# Patient Record
Sex: Male | Born: 1975 | Race: White | Hispanic: No | Marital: Married | Smoking: Former smoker
Health system: Southern US, Community
[De-identification: ages and names within clinical notes are randomized; demographics above are authoritative.]

## PROBLEM LIST (undated history)

## (undated) DIAGNOSIS — G4730 Sleep apnea, unspecified: Secondary | ICD-10-CM

## (undated) DIAGNOSIS — J309 Allergic rhinitis, unspecified: Secondary | ICD-10-CM

## (undated) DIAGNOSIS — K219 Gastro-esophageal reflux disease without esophagitis: Secondary | ICD-10-CM

## (undated) DIAGNOSIS — R0600 Dyspnea, unspecified: Secondary | ICD-10-CM

## (undated) DIAGNOSIS — R5383 Other fatigue: Secondary | ICD-10-CM

## (undated) HISTORY — DX: Gastro-esophageal reflux disease without esophagitis: K21.9

## (undated) HISTORY — DX: Other fatigue: R53.83

## (undated) HISTORY — DX: Dyspnea, unspecified: R06.00

## (undated) HISTORY — DX: Allergic rhinitis, unspecified: J30.9

---
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
## 2007-07-27 ENCOUNTER — Emergency Department (HOSPITAL_COMMUNITY): Admission: EM | Admit: 2007-07-27 | Discharge: 2007-07-27 | Payer: Self-pay | Admitting: Emergency Medicine

## 2008-03-24 ENCOUNTER — Emergency Department (HOSPITAL_COMMUNITY): Admission: EM | Admit: 2008-03-24 | Discharge: 2008-03-24 | Payer: Self-pay | Admitting: Family Medicine

## 2009-03-29 ENCOUNTER — Emergency Department (HOSPITAL_COMMUNITY): Admission: EM | Admit: 2009-03-29 | Discharge: 2009-03-29 | Payer: Self-pay | Admitting: Family Medicine

## 2009-06-01 ENCOUNTER — Encounter: Admission: RE | Admit: 2009-06-01 | Discharge: 2009-06-01 | Payer: Self-pay | Admitting: Internal Medicine

## 2010-07-04 ENCOUNTER — Other Ambulatory Visit: Payer: Self-pay | Admitting: Internal Medicine

## 2010-07-04 DIAGNOSIS — R1032 Left lower quadrant pain: Secondary | ICD-10-CM

## 2010-07-05 ENCOUNTER — Ambulatory Visit
Admission: RE | Admit: 2010-07-05 | Discharge: 2010-07-05 | Disposition: A | Discharge status: Home / Self Care | Payer: 59 | Source: Ambulatory Visit | Attending: Internal Medicine | Admitting: Internal Medicine

## 2010-07-05 DIAGNOSIS — R1032 Left lower quadrant pain: Secondary | ICD-10-CM

## 2010-07-05 MED ORDER — IOHEXOL 300 MG/ML  SOLN
150.0000 mL | Freq: Once | INTRAMUSCULAR | Status: AC | PRN
  Administered 2010-07-05: 125 mL via INTRAVENOUS

## 2010-12-14 ENCOUNTER — Emergency Department (INDEPENDENT_AMBULATORY_CARE_PROVIDER_SITE_OTHER)
Admission: EM | Admit: 2010-12-14 | Discharge: 2010-12-14 | Disposition: A | Discharge status: Home / Self Care | Payer: 59 | Source: Home / Self Care | Attending: Family Medicine | Admitting: Family Medicine

## 2010-12-14 ENCOUNTER — Encounter: Payer: Self-pay | Admitting: Cardiology

## 2010-12-14 DIAGNOSIS — L0102 Bockhart's impetigo: Secondary | ICD-10-CM

## 2010-12-14 DIAGNOSIS — L738 Other specified follicular disorders: Secondary | ICD-10-CM

## 2010-12-14 MED ORDER — MUPIROCIN CALCIUM 2 % EX CREA: TOPICAL_CREAM | Freq: Three times a day (TID) | CUTANEOUS | Status: AC

## 2010-12-14 NOTE — ED Notes (Signed)
Pt reports ingrown hair to testicle area that started 2 weeks ago. Denies fever. Pt reports area to be reddened some swelling tot the area. Pt squeezed area this morning with pink drainage.

## 2010-12-14 NOTE — ED Provider Notes (Signed)
History     CSN: [id number] Arrival date & time: 12/14/2010  8:39 AM   First MD Initiated Contact with Patient 12/14/10 [phone]      No chief complaint on file.   (Consider location/radiation/quality/duration/timing/severity/associated sxs/prior treatment) Patient is a 35 y.o. male presenting with abscess.  Abscess  This is a recurrent problem. The current episode started more than one week ago. The onset was gradual. The problem has been gradually improving. The abscess is present on the genitalia. The problem is mild. The abscess is characterized by redness and draining. The abscess first occurred at home.    No past medical history on file.  No past surgical history on file.  No family history on file.  History  Substance Use Topics  . Smoking status: Not on file  . Smokeless tobacco: Not on file  . Alcohol Use: Not on file      Review of Systems  Constitutional: Negative.   Skin: Positive for wound.    Allergies  Iodinated diagnostic agents  Home Medications   Current Outpatient Rx  Name Route Sig Dispense Refill  . MUPIROCIN CALCIUM 2 % EX CREA Topical Apply topically 3 (three) times daily. 15 g 0    BP 128/61  Pulse 79  Temp(Src) 98.2 F (36.8 C) (Oral)  Resp 16  SpO2 100%  Physical Exam  Nursing note and vitals reviewed. Constitutional: He appears well-developed and well-nourished.  Skin: Skin is warm.       ED Course  Procedures (including critical care time)  Labs Reviewed - No data to display No results found.   1. Pustular folliculitis       MDM          Barkley Bruns, MD 12/14/10 [phone]

## 2013-07-06 ENCOUNTER — Emergency Department (HOSPITAL_COMMUNITY)
Admission: EM | Admit: 2013-07-06 | Discharge: 2013-07-06 | Disposition: A | Discharge status: Home / Self Care | Payer: 59 | Attending: Emergency Medicine | Admitting: Emergency Medicine

## 2013-07-06 ENCOUNTER — Encounter (HOSPITAL_COMMUNITY): Payer: Self-pay | Admitting: Emergency Medicine

## 2013-07-06 DIAGNOSIS — K644 Residual hemorrhoidal skin tags: Secondary | ICD-10-CM | POA: Insufficient documentation

## 2013-07-06 DIAGNOSIS — F17200 Nicotine dependence, unspecified, uncomplicated: Secondary | ICD-10-CM | POA: Insufficient documentation

## 2013-07-06 DIAGNOSIS — Z79899 Other long term (current) drug therapy: Secondary | ICD-10-CM | POA: Insufficient documentation

## 2013-07-06 DIAGNOSIS — IMO0002 Reserved for concepts with insufficient information to code with codable children: Secondary | ICD-10-CM | POA: Insufficient documentation

## 2013-07-06 DIAGNOSIS — K922 Gastrointestinal hemorrhage, unspecified: Secondary | ICD-10-CM | POA: Insufficient documentation

## 2013-07-06 LAB — CBC WITH DIFFERENTIAL/PLATELET
BASOS ABS: 0 10*3/uL (ref 0.0–0.1)
BASOS PCT: 0 % (ref 0–1)
EOS ABS: 0.1 10*3/uL (ref 0.0–0.7)
Eosinophils Relative: 1 % (ref 0–5)
HCT: 42.5 % (ref 39.0–52.0)
Hemoglobin: 14 g/dL (ref 13.0–17.0)
Lymphocytes Relative: 20 % (ref 12–46)
Lymphs Abs: 2.1 10*3/uL (ref 0.7–4.0)
MCH: 27.9 pg (ref 26.0–34.0)
MCHC: 32.9 g/dL (ref 30.0–36.0)
MCV: 84.8 fL (ref 78.0–100.0)
Monocytes Absolute: 0.7 10*3/uL (ref 0.1–1.0)
Monocytes Relative: 7 % (ref 3–12)
Neutro Abs: 7.7 10*3/uL (ref 1.7–7.7)
Neutrophils Relative %: 72 % (ref 43–77)
Platelets: 312 10*3/uL (ref 150–400)
RBC: 5.01 MIL/uL (ref 4.22–5.81)
RDW: 14.3 % (ref 11.5–15.5)
WBC: 10.7 10*3/uL — AB (ref 4.0–10.5)

## 2013-07-06 MED ORDER — HYDROCORTISONE 2.5 % RE CREA: 1.0000 "application " | TOPICAL_CREAM | Freq: Two times a day (BID) | RECTAL | Status: DC

## 2013-07-06 MED ORDER — PSYLLIUM 28 % PO PACK: 1.0000 | PACK | Freq: Two times a day (BID) | ORAL | Status: DC

## 2013-07-06 MED ORDER — DOCUSATE SODIUM 100 MG PO CAPS: 100.0000 mg | ORAL_CAPSULE | Freq: Two times a day (BID) | ORAL | Status: DC

## 2013-07-06 NOTE — ED Provider Notes (Signed)
CSN: [id number]     Arrival date & time 07/06/13  1439 History   First MD Initiated Contact with Patient 07/06/13 1512     Chief Complaint  Patient presents with  . Rectal Bleeding     (Consider location/radiation/quality/duration/timing/severity/associated sxs/prior Treatment) HPI Comments: Pt comes in with cc of bloody stools. Intermittent constipation, no hx of bleeding in the past. Reports noticing a rectal mass on Sunday. There was some blood when he wiped y'day, and today, he has been having blood squirt out intermittently. Pt saw his pcp - who sent him to the ER. There is no hx of liver dz, alcohol abuse, heavy nsaid use, prednisone use. Pt has no chest pain, dib, dizziness.  Patient is a 38 y.o. male presenting with hematochezia. The history is provided by the patient.  Rectal Bleeding Associated symptoms: no abdominal pain and no vomiting     History reviewed. No pertinent past medical history. Past Surgical History  Procedure Laterality Date  . Breast surgery  1997    removal of fatty tissue from right breast.   Family History  Problem Relation Age of Onset  . Hypertension Mother    History  Substance Use Topics  . Smoking status: Current Every Day Smoker -- 0.50 packs/day  . Smokeless tobacco: Not on file  . Alcohol Use: Yes     Comment: occas  1-2 bdeers/wk    Review of Systems  Constitutional: Negative for activity change and appetite change.  Respiratory: Negative for cough and shortness of breath.   Cardiovascular: Negative for chest pain.  Gastrointestinal: Positive for constipation, blood in stool and hematochezia. Negative for nausea, vomiting, abdominal pain and diarrhea.  Genitourinary: Negative for dysuria.  Hematological: Does not bruise/bleed easily.      Allergies  Iodinated diagnostic agents; Amoxicillin; Biaxin; and Penicillins  Home Medications   Prior to Admission medications   Medication Sig Start Date End Date Taking? Authorizing  Provider  Multiple Vitamin (MULTIVITAMIN WITH MINERALS) TABS tablet Take 1 tablet by mouth daily.   Yes Historical Provider, MD  ranitidine (ZANTAC) 150 MG tablet Take 150 mg by mouth 2 (two) times daily.   Yes Historical Provider, MD  docusate sodium (COLACE) 100 MG capsule Take 1 capsule (100 mg total) by mouth every 12 (twelve) hours. 07/06/13   Derwood KaplanAnkit Nanavati, MD  hydrocortisone (ANUSOL-HC) 2.5 % rectal cream Place 1 application rectally 2 (two) times daily. 07/06/13   Derwood KaplanAnkit Nanavati, MD  psyllium (METAMUCIL SMOOTH TEXTURE) 28 % packet Take 1 packet by mouth 2 (two) times daily. 07/06/13   Ankit Nanavati, MD   BP 107/58  Pulse 69  Temp(Src) 99.2 F (37.3 C) (Oral)  Resp 20  SpO2 98% Physical Exam  Nursing note and vitals reviewed. Constitutional: He is oriented to person, place, and time. He appears well-developed.  HENT:  Head: Normocephalic and atraumatic.  Eyes: Conjunctivae and EOM are normal. Pupils are equal, round, and reactive to light.  Neck: Normal range of motion. Neck supple.  Cardiovascular: Normal rate and regular rhythm.   Pulmonary/Chest: Effort normal and breath sounds normal.  Abdominal: Soft. Bowel sounds are normal. He exhibits no distension. There is no tenderness. There is no rebound and no guarding.  Pt has an external hemorrhoid - abot 2-3 cm in DM appreciated, with no rectal mass on DRE. Pt has dark blood per rectal exam.  Neurological: He is alert and oriented to person, place, and time.  Skin: Skin is warm.    ED Course  Procedures (including critical care time) Labs Review Labs Reviewed  CBC WITH DIFFERENTIAL - Abnormal; Notable for the following:    WBC 10.7 (*)    All other components within normal limits  POCT GASTRIC OCCULT BLOOD (1-CARD TO LAB)    Imaging Review No results found.   EKG Interpretation None      MDM   Final diagnoses:  Lower GI bleed  External hemorrhoid, bleeding    DDx includes: Diverticular bleed Colon  cancer Rectal bleed Internal hemorrhoids External hemorrhoids  Pt with mildly painful lesion - consistent with external hemorrhoids. There is no thrombosis or hemorrhoids. Advised on SITz bath and stool softeners provided. Pt will be provided with GI f/u for the GI bleed. Return to the ER if the sx get worse.     Derwood KaplanAnkit Nanavati, MD 07/06/13 1752

## 2013-07-06 NOTE — ED Notes (Signed)
Per pt, had some hard stool on Sunday and had some blood in stool-went to PCP and when he did exam, blood squirted out-was told to come to ED for further exam

## 2013-07-06 NOTE — Discharge Instructions (Signed)
You have rectal bleeding from external hemorrhoids. You might even have internal hemorrhoid. Please take the meds as prescribed. Please use SITZ bath (read below) at least 2-3 times a day for 10 min. Please follow up with GI. Return to the ER if there is increased bleeding, pain, fevers, chills.   Hemorrhoids Hemorrhoids are swollen veins around the rectum or anus. There are two types of hemorrhoids:   Internal hemorrhoids. These occur in the veins just inside the rectum. They may poke through to the outside and become irritated and painful.  External hemorrhoids. These occur in the veins outside the anus and can be felt as a painful swelling or hard lump near the anus. CAUSES  Pregnancy.   Obesity.   Constipation or diarrhea.   Straining to have a bowel movement.   Sitting for long periods on the toilet.  Heavy lifting or other activity that caused you to strain.  Anal intercourse. SYMPTOMS   Pain.   Anal itching or irritation.   Rectal bleeding.   Fecal leakage.   Anal swelling.   One or more lumps around the anus.  DIAGNOSIS  Your caregiver may be able to diagnose hemorrhoids by visual examination. Other examinations or tests that may be performed include:   Examination of the rectal area with a gloved hand (digital rectal exam).   Examination of anal canal using a small tube (scope).   A blood test if you have lost a significant amount of blood.  A test to look inside the colon (sigmoidoscopy or colonoscopy). TREATMENT Most hemorrhoids can be treated at home. However, if symptoms do not seem to be getting better or if you have a lot of rectal bleeding, your caregiver may perform a procedure to help make the hemorrhoids get smaller or remove them completely. Possible treatments include:  1. Placing a rubber band at the base of the hemorrhoid to cut off the circulation (rubber band ligation).  2. Injecting a chemical to shrink the hemorrhoid  (sclerotherapy).  3. Using a tool to burn the hemorrhoid (infrared light therapy).  4. Surgically removing the hemorrhoid (hemorrhoidectomy).  5. Stapling the hemorrhoid to block blood flow to the tissue (hemorrhoid stapling).  HOME CARE INSTRUCTIONS   Eat foods with fiber, such as whole grains, beans, nuts, fruits, and vegetables. Ask your doctor about taking products with added fiber in them (fibersupplements).  Increase fluid intake. Drink enough water and fluids to keep your urine clear or pale yellow.   Exercise regularly.   Go to the bathroom when you have the urge to have a bowel movement. Do not wait.   Avoid straining to have bowel movements.   Keep the anal area dry and clean. Use wet toilet paper or moist towelettes after a bowel movement.   Medicated creams and suppositories may be used or applied as directed.   Only take over-the-counter or prescription medicines as directed by your caregiver.   Take warm sitz baths for 15-20 minutes, 3-4 times a day to ease pain and discomfort.   Place ice packs on the hemorrhoids if they are tender and swollen. Using ice packs between sitz baths may be helpful.   Put ice in a plastic bag.   Place a towel between your skin and the bag.   Leave the ice on for 15-20 minutes, 3-4 times a day.   Do not use a donut-shaped pillow or sit on the toilet for long periods. This increases blood pooling and pain.  SEEK MEDICAL CARE  IF:  You have increasing pain and swelling that is not controlled by treatment or medicine.  You have uncontrolled bleeding.  You have difficulty or you are unable to have a bowel movement.  You have pain or inflammation outside the area of the hemorrhoids. MAKE SURE YOU:  Understand these instructions.  Will watch your condition.  Will get help right away if you are not doing well or get worse. Document Released: 12/22/1999 Document Revised: 12/11/2011 Document Reviewed:  10/29/2011 [facility] Patient Information 2015 [place], [place]. This information is not intended to replace advice given to you by your health care provider. Make sure you discuss any questions you have with your health care provider. Sitz Bath A sitz bath is a warm water bath taken in the sitting position that covers only the hips and buttocks. It may be used for either healing or hygiene purposes. Sitz baths are also used to relieve pain, itching, or muscle spasms. The water may contain medicine. Moist heat will help you heal and relax.  HOME CARE INSTRUCTIONS  Take 3 to 4 sitz baths a day. 6. Fill the bathtub half full with warm water. 7. Sit in the water and open the drain a little. 8. Turn on the warm water to keep the tub half full. Keep the water running constantly. 9. Soak in the water for 15 to 20 minutes. 10. After the sitz bath, pat the affected area dry first. SEEK MEDICAL CARE IF:  You get worse instead of better. Stop the sitz baths if you get worse. MAKE SURE YOU:  Understand these instructions.  Will watch your condition.  Will get help right away if you are not doing well or get worse. Document Released: 09/16/2003 Document Revised: 09/18/2011 Document Reviewed: 03/23/2010 [facility] Patient Information 2015 [place], [place]. This information is not intended to replace advice given to you by your health care provider. Make sure you discuss any questions you have with your health care provider.

## 2013-07-07 ENCOUNTER — Ambulatory Visit (INDEPENDENT_AMBULATORY_CARE_PROVIDER_SITE_OTHER): Payer: 59 | Admitting: Internal Medicine

## 2013-07-07 ENCOUNTER — Encounter: Payer: Self-pay | Admitting: Internal Medicine

## 2013-07-07 VITALS — BP 122/88 | HR 92 | Ht 71.75 in | Wt 322.1 lb

## 2013-07-07 DIAGNOSIS — K645 Perianal venous thrombosis: Secondary | ICD-10-CM

## 2013-07-07 NOTE — Patient Instructions (Addendum)
You have been given a separate informational sheet regarding your tobacco use, the importance of quitting and local resources to help you quit.  Continue your colace.  Soak in the tub, sitz bath information given.  Today you have been given a hemorrhoid handout.  If your not improving call us back and we want to see you back in 6-8 weeks, appointment made for 09/10/13 at 8:30am.   I appreciate the opportunity to care for you.

## 2013-07-07 NOTE — Progress Notes (Signed)
Subjective:    Patient ID: Kevin Michael, male    DOB: 11/18/1975, 38 y.o.   MRN: [id number]  HPI The patient is a pleasant single white man with a few day history of anal pain. He noticed painful swelling in the anal area a few days ago. He went fishing from his kayak and things worsened. Then slight bleeding - bright red.  Saw PCP and says that during examination of the anorectum he started to bleed. "something popped". Profuse bleeding and was sent to ED - bright red blood and clots seen - dark clots. In ED CBC ok, dx hemorrhoids and dc. Since then (yesterday) has continued to have pain, ? Less and oozing of blood. Denies bloody stools. Has not defecated in 2 days. Using sitz bath with some help. Proctocream HC 2.5% Rx has not started.  Using stool softeners/psyllium.  Allergies  Allergen Reactions  . Iodinated Diagnostic Agents Itching and Rash    suggested 13 hour prep if cintrast is needed in future/mms  . Amoxicillin     As child   . Biaxin [Clarithromycin] Hives  . Penicillins     Rxn as child "made real sick"   Outpatient Prescriptions Prior to Visit  Medication Sig Dispense Refill  . docusate sodium (COLACE) 100 MG capsule Take 1 capsule (100 mg total) by mouth every 12 (twelve) hours.  60 capsule  0  . hydrocortisone (ANUSOL-HC) 2.5 % rectal cream Place 1 application rectally 2 (two) times daily.  30 g  0  . Multiple Vitamin (MULTIVITAMIN WITH MINERALS) TABS tablet Take 1 tablet by mouth daily.      . psyllium (METAMUCIL SMOOTH TEXTURE) 28 % packet Take 1 packet by mouth 2 (two) times daily.  30 packet  0  . ranitidine (ZANTAC) 150 MG tablet Take 150 mg by mouth 2 (two) times daily.       No facility-administered medications prior to visit.   Past Medical History  Diagnosis Date  . Allergic rhinitis   . GERD (gastroesophageal reflux disease)    Past Surgical History  Procedure Laterality Date  . Breast surgery Right 1997    removal of fatty tissue from  right breast.   History   Social History  . Marital Status: Single    Spouse Name: N/A    Number of Children: 0  . Years of Education: N/A   Occupational History  . heavy Arboriculturistequipment operator    Social History Main Topics  . Smoking status: Current Some Day Smoker -- 0.50 packs/day    Types: Cigarettes  . Smokeless tobacco: Never Used  . Alcohol Use: Yes     Comment: occas  1-2 bdeers/wk  . Drug Use: No    Social History Narrative   Single, no children   Heavy Arboriculturistequipment operator   1 or less caffeine drinks daily   Family History  Problem Relation Age of Onset  . Hypertension Mother   . Bladder Cancer Father    Review of Systems All other ROS negative except as per HPI    Objective:   Physical Exam WDWN WM NAD A and o x 3 anicteric Rectal  Patti [place], CMA present  Thrombosed RA external hemorrhoid with clot, ooze of blood Able to examine DRE after lidocaine, NTG No fissure, anal canal ok    Assessment & Plan:   Thrombosed external hemorrhoid - right anterior   Colace, fiber Recticare Sitz baths RTC 6- 8 weeks He  is off until 7/7  I appreciate the opportunity to care for this patient Iva Booparl E. Sharleen Szczesny, MD, FACG  ZO:[id number]:MACKENZIE,BRIAN, MD

## 2013-07-08 ENCOUNTER — Encounter: Payer: Self-pay | Admitting: Internal Medicine

## 2013-07-08 DIAGNOSIS — K645 Perianal venous thrombosis: Secondary | ICD-10-CM | POA: Insufficient documentation

## 2013-09-10 ENCOUNTER — Ambulatory Visit: Payer: 59 | Admitting: Internal Medicine

## 2014-01-23 ENCOUNTER — Emergency Department: Payer: Self-pay | Admitting: Emergency Medicine

## 2014-01-23 LAB — CBC
HCT: 42.7 % (ref 40.0–52.0)
HGB: 13.8 g/dL (ref 13.0–18.0)
MCH: 28.1 pg (ref 26.0–34.0)
MCHC: 32.3 g/dL (ref 32.0–36.0)
MCV: 87 fL (ref 80–100)
Platelet: 316 10*3/uL (ref 150–440)
RBC: 4.9 10*6/uL (ref 4.40–5.90)
RDW: 14.9 % — ABNORMAL HIGH (ref 11.5–14.5)
WBC: 12.3 10*3/uL — AB (ref 3.8–10.6)

## 2014-01-23 LAB — COMPREHENSIVE METABOLIC PANEL
ANION GAP: 10 (ref 7–16)
Albumin: 3.9 g/dL (ref 3.4–5.0)
Alkaline Phosphatase: 84 U/L
BILIRUBIN TOTAL: 0.3 mg/dL (ref 0.2–1.0)
BUN: 21 mg/dL — ABNORMAL HIGH (ref 7–18)
CHLORIDE: 107 mmol/L (ref 98–107)
CO2: 24 mmol/L (ref 21–32)
Calcium, Total: 8.6 mg/dL (ref 8.5–10.1)
Creatinine: 1.25 mg/dL (ref 0.60–1.30)
GLUCOSE: 94 mg/dL (ref 65–99)
OSMOLALITY: 284 (ref 275–301)
POTASSIUM: 3.7 mmol/L (ref 3.5–5.1)
SGOT(AST): 22 U/L (ref 15–37)
SGPT (ALT): 35 U/L
SODIUM: 141 mmol/L (ref 136–145)
Total Protein: 7.8 g/dL (ref 6.4–8.2)

## 2014-01-23 LAB — MONONUCLEOSIS SCREEN: Mono Test: NEGATIVE

## 2014-07-30 ENCOUNTER — Emergency Department
Admission: EM | Admit: 2014-07-30 | Discharge: 2014-07-30 | Disposition: A | Discharge status: Home / Self Care | Payer: Commercial Managed Care - HMO | Attending: Emergency Medicine | Admitting: Emergency Medicine

## 2014-07-30 ENCOUNTER — Encounter: Payer: Self-pay | Admitting: Emergency Medicine

## 2014-07-30 DIAGNOSIS — Z79899 Other long term (current) drug therapy: Secondary | ICD-10-CM | POA: Insufficient documentation

## 2014-07-30 DIAGNOSIS — Z7951 Long term (current) use of inhaled steroids: Secondary | ICD-10-CM | POA: Insufficient documentation

## 2014-07-30 DIAGNOSIS — B9689 Other specified bacterial agents as the cause of diseases classified elsewhere: Secondary | ICD-10-CM

## 2014-07-30 DIAGNOSIS — Z720 Tobacco use: Secondary | ICD-10-CM | POA: Diagnosis not present

## 2014-07-30 DIAGNOSIS — R1030 Lower abdominal pain, unspecified: Secondary | ICD-10-CM | POA: Diagnosis present

## 2014-07-30 DIAGNOSIS — L089 Local infection of the skin and subcutaneous tissue, unspecified: Secondary | ICD-10-CM | POA: Diagnosis not present

## 2014-07-30 DIAGNOSIS — Z880 Allergy status to penicillin: Secondary | ICD-10-CM | POA: Insufficient documentation

## 2014-07-30 MED ORDER — TRAMADOL HCL 50 MG PO TABS: 50.0000 mg | ORAL_TABLET | Freq: Four times a day (QID) | ORAL | Status: DC | PRN

## 2014-07-30 MED ORDER — SULFAMETHOXAZOLE-TRIMETHOPRIM 800-160 MG PO TABS: 1.0000 | ORAL_TABLET | Freq: Two times a day (BID) | ORAL | Status: DC

## 2014-07-30 NOTE — ED Notes (Signed)
AAOx3.  Skin warm and dry.  NAD.  D/C home 
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
## 2015-08-21 ENCOUNTER — Ambulatory Visit: Payer: Self-pay | Admitting: Family Medicine

## 2015-09-18 ENCOUNTER — Ambulatory Visit: Payer: Self-pay | Admitting: Family Medicine

## 2015-10-16 ENCOUNTER — Ambulatory Visit (INDEPENDENT_AMBULATORY_CARE_PROVIDER_SITE_OTHER): Payer: Commercial Managed Care - HMO | Admitting: Family Medicine

## 2015-10-16 ENCOUNTER — Encounter: Payer: Self-pay | Admitting: Family Medicine

## 2015-10-16 VITALS — BP 124/77 | HR 81 | Ht 71.25 in | Wt 338.2 lb

## 2015-10-16 DIAGNOSIS — K5909 Other constipation: Secondary | ICD-10-CM

## 2015-10-16 DIAGNOSIS — K219 Gastro-esophageal reflux disease without esophagitis: Secondary | ICD-10-CM | POA: Insufficient documentation

## 2015-10-16 DIAGNOSIS — Z87898 Personal history of other specified conditions: Secondary | ICD-10-CM

## 2015-10-16 DIAGNOSIS — Z6841 Body Mass Index (BMI) 40.0 and over, adult: Secondary | ICD-10-CM | POA: Diagnosis not present

## 2015-10-16 DIAGNOSIS — R7303 Prediabetes: Secondary | ICD-10-CM | POA: Insufficient documentation

## 2015-10-16 DIAGNOSIS — E6609 Other obesity due to excess calories: Secondary | ICD-10-CM

## 2015-10-16 DIAGNOSIS — K5900 Constipation, unspecified: Secondary | ICD-10-CM | POA: Insufficient documentation

## 2015-10-16 DIAGNOSIS — A6000 Herpesviral infection of urogenital system, unspecified: Secondary | ICD-10-CM

## 2015-10-16 DIAGNOSIS — J3089 Other allergic rhinitis: Secondary | ICD-10-CM

## 2015-10-16 NOTE — Patient Instructions (Signed)
Please realize, EXERCISE IS MEDICINE!  -  American Heart Association [facility]) guidelines for exercise : If you are in good health, without any medical conditions, you should engage in 150 minutes of moderate intensity aerobic activity per week.  This means you should be huffing and puffing throughout your workout.   Engaging in regular exercise will improve brain function and memory, as well as improve mood, boost immune system and help with weight management.  As well as the other, more well-known effects of exercise such as decreasing blood sugar levels, decreasing blood pressure,  and decreasing bad cholesterol levels/ increasing good cholesterol levels.     -  The AHA strongly endorses consumption of a diet that contains a variety of foods from all the food categories with an emphasis on fruits and vegetables; fat-free and low-fat dairy products; cereal and grain products; legumes and nuts; and fish, poultry, and/or extra lean meats.    Excessive food intake, especially of foods high in saturated and trans fats, sugar, and salt, should be avoided.    Adequate water intake of roughly 1/2 of your weight in pounds, should equal the ounces of water per day you should drink.  So for instance, if you're 200 pounds, that would be 100 ounces of water per day.         Mediterranean Diet  Why follow it? Research shows. . Those who follow the Mediterranean diet have a reduced risk of heart disease  . The diet is associated with a reduced incidence of Parkinson's and Alzheimer's diseases . People following the diet may have longer life expectancies and lower rates of chronic diseases  . The Dietary Guidelines for Americans recommends the Mediterranean diet as an eating plan to promote health and prevent disease  What Is the Mediterranean Diet?  . Healthy eating plan based on typical foods and recipes of Mediterranean-style cooking . The diet is primarily a plant based diet; these foods should make up a  majority of meals   Starches - Plant based foods should make up a majority of meals - They are an important sources of vitamins, minerals, energy, antioxidants, and fiber - Choose whole grains, foods high in fiber and minimally processed items  - Typical grain sources include wheat, oats, barley, corn, brown rice, bulgar, farro, millet, polenta, couscous  - Various types of beans include chickpeas, lentils, fava beans, black beans, white beans   Fruits  Veggies - Large quantities of antioxidant rich fruits & veggies; 6 or more servings  - Vegetables can be eaten raw or lightly drizzled with oil and cooked  - Vegetables common to the traditional Mediterranean Diet include: artichokes, arugula, beets, broccoli, brussel sprouts, cabbage, carrots, celery, collard greens, cucumbers, eggplant, kale, leeks, lemons, lettuce, mushrooms, okra, onions, peas, peppers, potatoes, pumpkin, radishes, rutabaga, shallots, spinach, sweet potatoes, turnips, zucchini - Fruits common to the Mediterranean Diet include: apples, apricots, avocados, cherries, clementines, dates, figs, grapefruits, grapes, melons, nectarines, oranges, peaches, pears, pomegranates, strawberries, tangerines  Fats - Replace butter and margarine with healthy oils, such as olive oil, canola oil, and tahini  - Limit nuts to no more than a handful a day  - Nuts include walnuts, almonds, pecans, pistachios, pine nuts  - Limit or avoid candied, honey roasted or heavily salted nuts - Olives are central to the Mediterranean diet - can be eaten whole or used in a variety of dishes   Meats Protein - Limiting red meat: no more than a few times a month -  When eating red meat: choose lean cuts and keep the portion to the size of deck of cards - Eggs: approx. 0 to 4 times a week  - Fish and lean poultry: at least 2 a week  - Healthy protein sources include, chicken, turkey, lean beef, lamb - Increase intake of seafood such as tuna, salmon, trout,  mackerel, shrimp, scallops - Avoid or limit high fat processed meats such as sausage and bacon  Dairy - Include moderate amounts of low fat dairy products  - Focus on healthy dairy such as fat free yogurt, skim milk, low or reduced fat cheese - Limit dairy products higher in fat such as whole or 2% milk, cheese, ice cream  Alcohol - Moderate amounts of red wine is ok  - No more than 5 oz daily for women (all ages) and men older than age 65  - No more than 10 oz of wine daily for men younger than 65  Other - Limit sweets and other desserts  - Use herbs and spices instead of salt to flavor foods  - Herbs and spices common to the traditional Mediterranean Diet include: basil, bay leaves, chives, cloves, cumin, fennel, garlic, lavender, marjoram, mint, oregano, parsley, pepper, rosemary, sage, savory, sumac, tarragon, thyme   It's not just a diet, it's a lifestyle:  . The Mediterranean diet includes lifestyle factors typical of those in the region  . Foods, drinks and meals are best eaten with others and savored . Daily physical activity is important for overall good health . This could be strenuous exercise like running and aerobics . This could also be more leisurely activities such as walking, housework, yard-work, or taking the stairs . Moderation is the key; a balanced and healthy diet accommodates most foods and drinks . Consider portion sizes and frequency of consumption of certain foods   Meal Ideas & Options:  . Breakfast:  o Whole wheat toast or whole wheat English muffins with peanut butter & hard boiled egg o Steel cut oats topped with apples & cinnamon and skim milk  o Fresh fruit: banana, strawberries, melon, berries, peaches  o Smoothies: strawberries, bananas, greek yogurt, peanut butter o Low fat greek yogurt with blueberries and granola  o Egg white omelet with spinach and mushrooms o Breakfast couscous: whole wheat couscous, apricots, skim milk, cranberries  . Sandwiches:   o Hummus and grilled vegetables (peppers, zucchini, squash) on whole wheat bread   o Grilled chicken on whole wheat pita with lettuce, tomatoes, cucumbers or tzatziki  o Tuna salad on whole wheat bread: tuna salad made with greek yogurt, olives, red peppers, capers, green onions o Garlic rosemary lamb pita: lamb sauted with garlic, rosemary, salt & pepper; add lettuce, cucumber, greek yogurt to pita - flavor with lemon juice and black pepper  . Seafood:  o Mediterranean grilled salmon, seasoned with garlic, basil, parsley, lemon juice and black pepper o Shrimp, lemon, and spinach whole-grain pasta salad made with low fat greek yogurt  o Seared scallops with lemon orzo  o Seared tuna steaks seasoned salt, pepper, coriander topped with tomato mixture of olives, tomatoes, olive oil, minced garlic, parsley, green onions and cappers  . Meats:  o Herbed greek chicken salad with kalamata olives, cucumber, feta  o Red bell peppers stuffed with spinach, bulgur, lean ground beef (or lentils) & topped with feta   o Kebabs: skewers of chicken, tomatoes, onions, zucchini, squash  o Turkey burgers: made with red onions, mint, dill, lemon juice, feta   cheese topped with roasted red peppers . Vegetarian o Cucumber salad: cucumbers, artichoke hearts, celery, red onion, feta cheese, tossed in olive oil & lemon juice  o Hummus and whole grain pita points with a greek salad (lettuce, tomato, feta, olives, cucumbers, red onion) o Lentil soup with celery, carrots made with vegetable broth, garlic, salt and pepper  o Tabouli salad: parsley, bulgur, mint, scallions, cucumbers, tomato, radishes, lemon juice, olive oil, salt and pepper.     Obesity Obesity is defined as having too much total body fat and a body mass index (BMI) of 30 or more. BMI is an estimate of body fat and is calculated from your height and weight. BMI is typically calculated by your health care provider during regular wellness visits. Obesity  happens when you consume more calories than you can burn by exercising or performing daily physical tasks. Prolonged obesity can cause major illnesses or emergencies, such as:  Stroke.  Heart disease.  Diabetes.  Cancer.  Arthritis.  High blood pressure (hypertension).  High cholesterol.  Sleep apnea.  Erectile dysfunction.  Infertility problems. CAUSES   Regularly eating unhealthy foods.  Physical inactivity.  Certain disorders, such as an underactive thyroid (hypothyroidism), Cushing's syndrome, and polycystic ovarian syndrome.  Certain medicines, such as steroids, some depression medicines, and antipsychotics.  Genetics.  Lack of sleep. DIAGNOSIS A health care provider can diagnose obesity after calculating your BMI. Obesity will be diagnosed if your BMI is 30 or higher. There are other methods of measuring obesity levels. Some other methods include measuring your skinfold thickness, your waist circumference, and comparing your hip circumference to your waist circumference. TREATMENT  A healthy treatment program includes some or all of the following:  Long-term dietary changes.  Exercise and physical activity.  Behavioral and lifestyle changes.  Medicine only under the supervision of your health care provider. Medicines may help, but only if they are used with diet and exercise programs. If your BMI is 40 or higher, your health care provider may recommend specialized surgery or programs to help with weight loss. An unhealthy treatment program includes:  Fasting.  Fad diets.  Supplements and drugs. These choices do not succeed in long-term weight control. HOME CARE INSTRUCTIONS  Exercise and perform physical activity as directed by your health care provider. To increase physical activity, try the following:  Use stairs instead of elevators.  Park farther away from store entrances.  Garden, bike, or walk instead of watching television or using the  computer.  Eat healthy, low-calorie foods and drinks on a regular basis. Eat more fruits and vegetables. Use low-calorie cookbooks or take healthy cooking classes.  Limit fast food, sweets, and processed snack foods.  Eat smaller portions.  Keep a daily journal of everything you eat. There are many free websites to help you with this. It may be helpful to measure your foods so you can determine if you are eating the correct portion sizes.  Avoid drinking alcohol. Drink more water and drinks without calories.  Take vitamins and supplements only as recommended by your health care provider.  Weight-loss support groups, Government social research officer, counselors, and stress reduction education can also be very helpful. SEEK IMMEDIATE MEDICAL CARE IF:  You have chest pain or tightness.  You have trouble breathing or feel short of breath.  You have weakness or leg numbness.  You feel confused or have trouble talking.  You have sudden changes in your vision.   This information is not intended to replace advice given to  you by your health care provider. Make sure you discuss any questions you have with your health care provider.   Document Released: 02/01/2004 Document Revised: 01/14/2014 Document Reviewed: 01/30/2011 Elsevier Interactive Patient Education [employer]2016 [employer]vier Inc.
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
## 2015-10-16 NOTE — Assessment & Plan Note (Signed)
outbrks a couple time per month

## 2015-11-22 ENCOUNTER — Other Ambulatory Visit (INDEPENDENT_AMBULATORY_CARE_PROVIDER_SITE_OTHER): Payer: Commercial Managed Care - HMO

## 2015-11-22 ENCOUNTER — Other Ambulatory Visit (HOSPITAL_COMMUNITY)
Admission: RE | Admit: 2015-11-22 | Discharge: 2015-11-22 | Disposition: A | Discharge status: Home / Self Care | Payer: Commercial Managed Care - HMO | Source: Ambulatory Visit | Attending: Family Medicine | Admitting: Family Medicine

## 2015-11-22 DIAGNOSIS — K219 Gastro-esophageal reflux disease without esophagitis: Secondary | ICD-10-CM

## 2015-11-22 DIAGNOSIS — Z6841 Body Mass Index (BMI) 40.0 and over, adult: Secondary | ICD-10-CM

## 2015-11-22 DIAGNOSIS — K5909 Other constipation: Secondary | ICD-10-CM

## 2015-11-22 DIAGNOSIS — Z87898 Personal history of other specified conditions: Secondary | ICD-10-CM

## 2015-11-22 DIAGNOSIS — J3089 Other allergic rhinitis: Secondary | ICD-10-CM

## 2015-11-22 DIAGNOSIS — Z113 Encounter for screening for infections with a predominantly sexual mode of transmission: Secondary | ICD-10-CM | POA: Diagnosis present

## 2015-11-22 DIAGNOSIS — A6000 Herpesviral infection of urogenital system, unspecified: Secondary | ICD-10-CM

## 2015-11-22 LAB — URINE CYTOLOGY ANCILLARY ONLY
Chlamydia: NEGATIVE
Neisseria Gonorrhea: NEGATIVE
TRICH (WINDOWPATH): NEGATIVE

## 2015-11-23 LAB — TSH: TSH: 2.04 m[IU]/L (ref 0.40–4.50)

## 2015-11-23 LAB — CBC WITH DIFFERENTIAL/PLATELET
BASOS ABS: 54 {cells}/uL (ref 0–200)
BASOS PCT: 1 %
EOS ABS: 108 {cells}/uL (ref 15–500)
Eosinophils Relative: 2 %
HCT: 40.2 % (ref 38.5–50.0)
HEMOGLOBIN: 13.2 g/dL (ref 13.2–17.1)
LYMPHS ABS: 1512 {cells}/uL (ref 850–3900)
Lymphocytes Relative: 28 %
MCH: 28.6 pg (ref 27.0–33.0)
MCHC: 32.8 g/dL (ref 32.0–36.0)
MCV: 87.2 fL (ref 80.0–100.0)
MONO ABS: 540 {cells}/uL (ref 200–950)
MPV: 9.2 fL (ref 7.5–12.5)
Monocytes Relative: 10 %
NEUTROS ABS: 3186 {cells}/uL (ref 1500–7800)
Neutrophils Relative %: 59 %
PLATELETS: 283 10*3/uL (ref 140–400)
RBC: 4.61 MIL/uL (ref 4.20–5.80)
RDW: 14.3 % (ref 11.0–15.0)
WBC: 5.4 10*3/uL (ref 3.8–10.8)

## 2015-11-23 LAB — HEPATITIS C ANTIBODY: HCV Ab: NEGATIVE

## 2015-11-23 LAB — LIPID PANEL
CHOL/HDL RATIO: 6 ratio — AB (ref <5.0)
CHOLESTEROL: 169 mg/dL (ref <200)
HDL: 28 mg/dL — ABNORMAL LOW (ref >40)
LDL Cholesterol: 115 mg/dL — ABNORMAL HIGH (ref <100)
Triglycerides: 131 mg/dL (ref <150)
VLDL: 26 mg/dL (ref <30)

## 2015-11-23 LAB — COMPLETE METABOLIC PANEL WITH GFR
ALBUMIN: 4 g/dL (ref 3.6–5.1)
ALK PHOS: 67 U/L (ref 40–115)
ALT: 17 U/L (ref 9–46)
AST: 15 U/L (ref 10–40)
BILIRUBIN TOTAL: 0.7 mg/dL (ref 0.2–1.2)
BUN: 11 mg/dL (ref 7–25)
CO2: 28 mmol/L (ref 20–31)
CREATININE: 1.04 mg/dL (ref 0.60–1.35)
Calcium: 8.9 mg/dL (ref 8.6–10.3)
Chloride: 107 mmol/L (ref 98–110)
GLUCOSE: 115 mg/dL — AB (ref 65–99)
Potassium: 4.3 mmol/L (ref 3.5–5.3)
Sodium: 138 mmol/L (ref 135–146)
TOTAL PROTEIN: 6.5 g/dL (ref 6.1–8.1)

## 2015-11-23 LAB — T4, FREE: FREE T4: 1.1 ng/dL (ref 0.8–1.8)

## 2015-11-23 LAB — HEMOGLOBIN A1C
Hgb A1c MFr Bld: 5.7 % — ABNORMAL HIGH (ref <5.7)
MEAN PLASMA GLUCOSE: 117 mg/dL

## 2015-11-23 LAB — VITAMIN D 25 HYDROXY (VIT D DEFICIENCY, FRACTURES): VIT D 25 HYDROXY: 24 ng/mL — AB (ref 30–100)

## 2015-11-23 LAB — HIV ANTIBODY (ROUTINE TESTING W REFLEX): HIV 1&2 Ab, 4th Generation: NONREACTIVE

## 2015-11-23 LAB — VITAMIN B12: Vitamin B-12: 404 pg/mL (ref 200–1100)

## 2015-11-23 LAB — RPR

## 2015-11-27 LAB — URINE CYTOLOGY ANCILLARY ONLY
Bacterial vaginitis: NEGATIVE
CANDIDA VAGINITIS: NEGATIVE

## 2015-11-29 ENCOUNTER — Emergency Department
Admission: EM | Admit: 2015-11-29 | Discharge: 2015-11-29 | Disposition: A | Discharge status: Home / Self Care | Payer: Commercial Managed Care - HMO | Attending: Emergency Medicine | Admitting: Emergency Medicine

## 2015-11-29 ENCOUNTER — Encounter: Payer: Self-pay | Admitting: Emergency Medicine

## 2015-11-29 DIAGNOSIS — F17210 Nicotine dependence, cigarettes, uncomplicated: Secondary | ICD-10-CM | POA: Insufficient documentation

## 2015-11-29 DIAGNOSIS — J069 Acute upper respiratory infection, unspecified: Secondary | ICD-10-CM | POA: Diagnosis not present

## 2015-11-29 DIAGNOSIS — R0981 Nasal congestion: Secondary | ICD-10-CM | POA: Diagnosis present

## 2015-11-29 DIAGNOSIS — Z79899 Other long term (current) drug therapy: Secondary | ICD-10-CM | POA: Insufficient documentation

## 2015-11-29 DIAGNOSIS — K1321 Leukoplakia of oral mucosa, including tongue: Secondary | ICD-10-CM | POA: Diagnosis not present

## 2015-11-29 LAB — POCT RAPID STREP A: STREPTOCOCCUS, GROUP A SCREEN (DIRECT): NEGATIVE

## 2015-11-29 MED ORDER — OXYMETAZOLINE HCL 0.05 % NA SOLN
1.0000 | Freq: Once | NASAL | Status: AC
  Administered 2015-11-29: 1 via NASAL
  Filled 2015-11-29: qty 15

## 2015-11-29 NOTE — ED Triage Notes (Signed)
Pt c/o vomiting this AM X 1. Has had sinus congestion and nasal drainage. Reports thinks the drainage down back of throat made him throw up. Denies any pain including abdominal pain. Has a scratchy throat. No diarrhea. Appears well.

## 2015-11-29 NOTE — ED Provider Notes (Signed)
Time Seen: Approximately 0733  I have reviewed the triage notes  Chief Complaint: Emesis and Nasal Congestion   History of Present Illness: Kevin Michael is a 40 y.o. male *Who presents with nasal congestion and postnasal drip. He states he felt the nasal drainage down the back of his throat and periodically coughed up a small amount of blood with no persistent hemoptysis. He states he got gagged and vomited 1 but no ongoing nausea and vomiting. He denies any fever. Denies any diarrhea. He states most of the symptoms started in the throat and nasal passages.   Past Medical History:  Diagnosis Date  . Allergic rhinitis   . GERD (gastroesophageal reflux disease)     Patient Active Problem List   Diagnosis Date Noted  . History of prediabetes 10/16/2015  . Constipation 10/16/2015  . GERD (gastroesophageal reflux disease) 10/16/2015  . Environmental and seasonal allergies 10/16/2015  . BMI 45.0-49.9, adult (HCC) 10/16/2015  . Genital herpes 10/16/2015  . Thrombosed external hemorrhoid - right anterior 07/08/2013    Past Surgical History:  Procedure Laterality Date  . BREAST SURGERY Right 1997   removal of fatty tissue from right breast.    Past Surgical History:  Procedure Laterality Date  . BREAST SURGERY Right 1997   removal of fatty tissue from right breast.    Current Outpatient Rx  . Order #: [id number] Class: Historical Med  . Order #: [id number] Class: Print  . Order #: [id number] Class: Historical Med  . Order #: [id number] Class: Historical Med    Allergies:  Iodinated diagnostic agents; Amoxicillin; Biaxin [clarithromycin]; and Penicillins  Family History: Family History  Problem Relation Age of Onset  . Hypertension Mother   . Bladder Cancer Father   . Cancer Father     bladder  . Cancer Paternal Grandfather     bladder/prostate    Social History: Social History  Substance Use Topics  . Smoking status: Current Some Day Smoker    Packs/day: 0.50   Years: 15.00    Types: Cigarettes  . Smokeless tobacco: Never Used  . Alcohol use 1.2 oz/week    2 Cans of beer per week     Review of Systems:   10 point review of systems was performed and was otherwise negative:  Constitutional: No fever Eyes: No visual disturbances ENT: sore scratchy throat without any ear pain Cardiac: No chest pain Respiratory: No shortness of breath, wheezing, or stridor Abdomen: No abdominal pain, no vomiting, No diarrhea Endocrine: No weight loss, No night sweats Extremities: No peripheral edema, cyanosis Skin: he states he had a small knot at the base of his beard. He states he noticed it a couple days ago and then seemed to resolve on its own.no ongoing fever. Neurologic: No focal weakness, trouble with speech or swollowing Urologic: No dysuria, Hematuria, or urinary frequency   Physical Exam:  ED Triage Vitals [11/29/15 0721]  Enc Vitals Group     BP (!) 153/79     Pulse Rate 94     Resp 18     Temp 98 F (36.7 C)     Temp Source Oral     SpO2 97 %     Weight (!) 330 lb (149.7 kg)     Height 6' (1.829 m)     Head Circumference      Peak Flow      Pain Score      Pain Loc      Pain Edu?  Excl. in GC?     General: Awake , Alert , and Oriented times 3; GCS 15 Head: Normal cephalic , atraumatic Eyes: Pupils equal , round, reactive to light Nose/Throat: nasal drainage bilaterally. No upper airway with some mild erythema across the soft palate no exudate or drainage. Neck: Supple, Full range of motion, No anterior adenopathy or palpable thyroid masses Lungs: Clear to ascultation without wheezes , rhonchi, or rales Heart: Regular rate, regular rhythm without murmurs , gallops , or rubs Abdomen: Soft, non tender without rebound, guarding , or rigidity; bowel sounds positive and symmetric in all 4 quadrants. No organomegaly .        Extremities: 2 plus symmetric pulses. No edema, clubbing or cyanosis Neurologic: normal ambulation, Motor  symmetric without deficits, sensory intact Skin: warm, dry, no rashes   Labs:   Strep test poc was negative   ED Course: * Patient's stay here was uneventful and he asked me to really locate the back of his throat. Patient is concerned about some white patches that he states he had reviewed with an ENT who didn't really, and said it was "" going to be okay "". I looked at the patient's back on the right side in the mucosa shows some mild leukoplakia. I do not see anything that look like cancer at this time but advised him to follow up with ENT to see if the patient needs a biopsy or tissue samples or something along these lines. His vomiting was likely due to the postnasal dripping gagging and I felt it was unlikely that he had actually aspirated Clinical Course      Assessment: * Upper respiratory tract infection Leukoplakia     Plan:  Outpatient Patient was advised to return immediately if condition worsens. Patient was advised to follow up with their primary care physician or other specialized physicians involved in their outpatient care. The patient and/or family member/power of attorney had laboratory results reviewed at the bedside. All questions and concerns were addressed and appropriate discharge instructions were distributed by the nursing staff.            Jennye MoccasinBrian S Quigley, MD 11/29/15 [phone]

## 2015-11-29 NOTE — Discharge Instructions (Signed)
Please return immediately if condition worsens. Please contact her primary physician or the physician you were given for referral. If you have any specialist physicians involved in her treatment and plan please also contact them. Thank you for using [facility]. ° °

## 2015-12-04 ENCOUNTER — Ambulatory Visit: Payer: Self-pay | Admitting: Family Medicine

## 2015-12-11 ENCOUNTER — Encounter: Payer: Self-pay | Admitting: Family Medicine

## 2015-12-11 ENCOUNTER — Ambulatory Visit (INDEPENDENT_AMBULATORY_CARE_PROVIDER_SITE_OTHER): Payer: Commercial Managed Care - HMO | Admitting: Family Medicine

## 2015-12-11 VITALS — BP 125/75 | HR 71 | Ht 71.25 in | Wt 342.4 lb

## 2015-12-11 DIAGNOSIS — E559 Vitamin D deficiency, unspecified: Secondary | ICD-10-CM | POA: Diagnosis not present

## 2015-12-11 DIAGNOSIS — Z6841 Body Mass Index (BMI) 40.0 and over, adult: Secondary | ICD-10-CM

## 2015-12-11 DIAGNOSIS — R7303 Prediabetes: Secondary | ICD-10-CM | POA: Diagnosis not present

## 2015-12-11 DIAGNOSIS — A6000 Herpesviral infection of urogenital system, unspecified: Secondary | ICD-10-CM | POA: Diagnosis not present

## 2015-12-11 MED ORDER — VITAMIN D3 125 MCG (5000 UT) PO TABS: ORAL_TABLET | ORAL | 3 refills | Status: DC

## 2015-12-11 NOTE — Patient Instructions (Signed)
Prediabetes Eating Plan Prediabetes--also called impaired glucose tolerance or impaired fasting glucose--is a condition that causes blood sugar (blood glucose) levels to be higher than normal. Following a healthy diet can help to keep prediabetes under control. It can also help to lower the risk of type 2 diabetes and heart disease, which are increased in people who have prediabetes. Along with regular exercise, a healthy diet:  Promotes weight loss.  Helps to control blood sugar levels.  Helps to improve the way that the body uses insulin. WHAT DO I NEED TO KNOW ABOUT THIS EATING PLAN?  Use the glycemic index (GI) to plan your meals. The index tells you how quickly a food will raise your blood sugar. Choose low-GI foods. These foods take a longer time to raise blood sugar.  Pay close attention to the amount of carbohydrates in the food that you eat. Carbohydrates increase blood sugar levels.  Keep track of how many calories you take in. Eating the right amount of calories will help you to achieve a healthy weight. Losing about 7 percent of your starting weight can help to prevent type 2 diabetes.  You may want to follow a Mediterranean diet. This diet includes a lot of vegetables, lean meats or fish, whole grains, fruits, and healthy oils and fats. WHAT FOODS CAN I EAT? Grains Whole grains, such as whole-wheat or whole-grain breads, crackers, cereals, and pasta. Unsweetened oatmeal. Bulgur. Barley. Quinoa. Brown rice. Corn or whole-wheat flour tortillas or taco shells. Vegetables Lettuce. Spinach. Peas. Beets. Cauliflower. Cabbage. Broccoli. Carrots. Tomatoes. Squash. Eggplant. Herbs. Peppers. Onions. Cucumbers. Brussels sprouts. Fruits Berries. Bananas. Apples. Oranges. Grapes. Papaya. Mango. Pomegranate. Kiwi. Grapefruit. Cherries. Meats and Other Protein Sources Seafood. Lean meats, such as chicken and turkey or lean cuts of pork and beef. Tofu. Eggs. Nuts. Beans. Dairy Low-fat or  fat-free dairy products, such as yogurt, cottage cheese, and cheese. Beverages Water. Tea. Coffee. Sugar-free or diet soda. Seltzer water. Milk. Milk alternatives, such as soy or almond milk. Condiments Mustard. Relish. Low-fat, low-sugar ketchup. Low-fat, low-sugar barbecue sauce. Low-fat or fat-free mayonnaise. Sweets and Desserts Sugar-free or low-fat pudding. Sugar-free or low-fat ice cream and other frozen treats. Fats and Oils Avocado. Walnuts. Olive oil. The items listed above may not be a complete list of recommended foods or beverages. Contact your dietitian for more options.  WHAT FOODS ARE NOT RECOMMENDED? Grains Refined white flour and flour products, such as bread, pasta, snack foods, and cereals. Beverages Sweetened drinks, such as sweet iced tea and soda. Sweets and Desserts Baked goods, such as cake, cupcakes, pastries, cookies, and cheesecake. The items listed above may not be a complete list of foods and beverages to avoid. Contact your dietitian for more information.   This information is not intended to replace advice given to you by your health care provider. Make sure you discuss any questions you have with your health care provider.   Document Released: 05/10/2014 Document Reviewed: 05/10/2014 Elsevier Interactive Patient Education 2016 [employer]     Risk factors for prediabetes and type 2 diabetes  Researchers don't fully understand why some people develop prediabetes and type 2 diabetes and others don't.  It's clear that certain factors increase the risk, however, including:  Weight. The more fatty tissue you have, the more resistant your cells become to insulin.  Inactivity. The less active you are, the greater your risk. Physical activity helps you control your weight, uses up glucose as energy and makes your cells more sensitive to insulin.    Family history. Your risk increases if a parent or sibling has type 2 diabetes.  Race. Although it's unclear  why, people of certain races - including blacks, Hispanics, American Indians and Asian-Americans - are at higher risk.  Age. Your risk increases as you get older. This may be because you tend to exercise less, lose muscle mass and gain weight as you age. But type 2 diabetes is also increasing dramatically among children, adolescents and younger adults.  Gestational diabetes. If you developed gestational diabetes when you were pregnant, your risk of developing prediabetes and type 2 diabetes later increases. If you gave birth to a baby weighing more than 9 pounds (4 kilograms), you're also at risk of type 2 diabetes.  Polycystic ovary syndrome. For women, having polycystic ovary syndrome - a common condition characterized by irregular menstrual periods, excess hair growth and obesity - increases the risk of diabetes.  High blood pressure. Having blood pressure over 140/90 millimeters of mercury (mm Hg) is linked to an increased risk of type 2 diabetes.  Abnormal cholesterol and triglyceride levels. If you have low levels of high-density lipoprotein (HDL), or "good," cholesterol, your risk of type 2 diabetes is higher. Triglycerides are another type of fat carried in the blood. People with high levels of triglycerides have an increased risk of type 2 diabetes. Your doctor can let you know what your cholesterol and triglyceride levels are.    A good guide to good carbs: The glycemic index ---If you have diabetes, or at risk for diabetes, you know all too well that when you eat carbohydrates, your blood sugar goes up. The total amount of carbs you consume at a meal or in a snack mostly determines what your blood sugar will do. But the food itself also plays a role. A serving of white rice has almost the same effect as eating pure table sugar - a quick, high spike in blood sugar. A serving of lentils has a slower, smaller effect.  ---Picking good sources of carbs can help you control your blood sugar and your  weight. Even if you don't have diabetes, eating healthier carbohydrate-rich foods can help ward off a host of chronic conditions, from heart disease to various cancers to, well, diabetes.  ---One way to choose foods is with the glycemic index (GI). This tool measures how much a food boosts blood sugar.  The glycemic index rates the effect of a specific amount of a food on blood sugar compared with the same amount of pure glucose. A food with a glycemic index of 28 boosts blood sugar only 28% as much as pure glucose. One with a GI of 95 acts like pure glucose.  High glycemic foods result in a quick spike in insulin and blood sugar (also known as blood glucose).  Low glycemic foods have a slower, smaller effect- these are healthier for you.   Using the glycemic index Using the glycemic index is easy: choose foods in the low GI category instead of those in the high GI category (see below), and go easy on those in between. Low glycemic index (GI of 55 or less): Most fruits and vegetables, beans, minimally processed grains, pasta, low-fat dairy foods, and nuts.  Moderate glycemic index (GI 56 to 69): White and sweet potatoes, corn, white rice, couscous, breakfast cereals such as Cream of Wheat and Mini Wheats.  High glycemic index (GI of 70 or higher): White bread, rice cakes, most crackers, bagels, cakes, doughnuts, croissants, most packaged breakfast cereals. You   can see the values for 100 comm[employer] foods and get links to more at www.health.[employer].edu/glycemic.  Swaps for lowering glycemic index  Instead of this high-glycemic index food Eat this lower-glycemic index food  White rice Brown rice or converted rice  Instant oatmeal Steel-cut oats  Cornflakes Bran flakes  Baked potato Pasta, bulgur  White bread Whole-grain bread  Corn Peas or leafy greens      Guidelines for a Low Cholesterol, Low Saturated Fat Diet   Fats - Limit total intake of fats and oils. - Avoid butter, stick margarine,  shortening, lard, palm and coconut oils. - Limit mayonnaise, salad dressings, gravies and sauces, unless they are homemade with low-fat ingredients. - Limit chocolate. - Choose low-fat and nonfat products, such as low-fat mayonnaise, low-fat or non-hydrogenated peanut butter, low-fat or fat-free salad dressings and nonfat gravy. - Use vegetable oil, such as canola or olive oil. - Look for margarine that does not contain trans fatty acids. - Use nuts in moderate amounts. - Read ingredient labels carefully to determine both amount and type of fat present in foods. Limit saturated and trans fats! - Avoid high-fat processed and convenience foods.  Meats and Meat Alternatives - Choose fish, chicken, turkey and lean meats. - Use dried beans, peas, lentils and tofu. - Limit egg yolks to three to four per week. - If you eat red meat, limit to no more than three servings per week and choose loin or round cuts. - Avoid fatty meats, such as bacon, sausage, franks, luncheon meats and ribs. - Avoid all organ meats, including liver.  Dairy - Choose nonfat or low-fat milk, yogurt and cottage cheese. - Most cheeses are high in fat. Choose cheeses made from non-fat milk, such as mozzarella and ricotta cheese. - Choose light or fat-free cream cheese and sour cream. - Avoid cream and sauces made with cream.  Fruits and Vegetables - Eat a wide variety of fruits and vegetables. - Use lemon juice, vinegar or "mist" olive oil on vegetables. - Avoid adding sauces, fat or oil to vegetables.  Breads, Cereals and Grains - Choose whole-grain breads, cereals, pastas and rice. - Avoid high-fat snack foods, such as granola, cookies, pies, pastries, doughnuts and croissants.  Cooking Tips - Avoid deep fried foods. - Trim visible fat off meats and remove skin from poultry before cooking. - Bake, broil, boil, poach or roast poultry, fish and lean meats. - Drain and discard fat that drains out of meat as you cook  it. - Add little or no fat to foods. - Use vegetable oil sprays to grease pans for cooking or baking. - Steam vegetables. - Use herbs or no-oil marinades to flavor foods. 

## 2015-12-11 NOTE — Assessment & Plan Note (Addendum)
Vit D3 - 5 Thousand international units daily advised. Over-the-counter. Recheck 4-6 months.

## 2015-12-11 NOTE — Assessment & Plan Note (Signed)
-   Counseled patient on pathophysiology of the disease process of Pre-DM.   Stressed importance of dietary and lifestyle modifications as first line, in addition to discussing the risks and benefits of metformin.    - Handouts provided.    - Anticipatory guidance given.   - Recheck A1c in 4-1056mo.  Told to make appt for this prior to leaving clinic today.   - Encouraged to return to clinic or call the office with any further questions or concerns.

## 2015-12-11 NOTE — Progress Notes (Signed)
Assessment and plan:  1. BMI 45.0-49.9, adult ([place])   2. Prediabetes   3. Vitamin D deficiency   4. Genital herpes simplex, unspecified site     Vitamin D deficiency Vit D3 - 5 Thousand international units daily advised. Over-the-counter. Recheck 4-6 months.  Prediabetes - Counseled patient on pathophysiology of the disease process of Pre-DM.   Stressed importance of dietary and lifestyle modifications as first line, in addition to discussing the risks and benefits of metformin.    - Handouts provided.    - Anticipatory guidance given.   - Recheck A1c in 4-39mo  Told to make appt for this prior to leaving clinic today.   - Encouraged to return to clinic or call the office with any further questions or concerns.  BMI 45.0-49.9, adult ([facility] Counseling done- advised wt loss.  Use lose it or my fitness pal---> track all food and drinks.   Discussed with patient importance of weight loss to help achieve health goals and how increasing weight, correlates to increasing risk of disease. (or increasing risk of not controlling existing diseases.)  Genital herpes Advised to take 500 mg daily and then with any acute outbreaks-take the tabs 3 times daily for 5 days-  then back to 1 tab daily for chronic suppression therapy.    New Prescriptions   CHOLECALCIFEROL (VITAMIN D3) 5000 UNITS TABS    5,000 IU OTC vitamin D3 daily.   Return for 4-8 wks as desired to discuss wt loss goals/ plan.  Anticipatory guidance and routine counseling done re: condition, txmnt options and need for follow up. All questions of patient's were answered.   Gross side effects, risk and benefits, and alternatives of medications discussed with patient.  Patient is aware that all medications have potential side effects and we are unable to predict every sideeffect or drug-drug interaction that may occur.  Expresses verbal understanding and consents  to current therapy plan and treatment regiment.  Please see AVS handed out to patient at the end of our visit for additional patient instructions/ counseling done pertaining to today's office visit.  Note: This document was prepared using Dragon voice recognition software and may include unintentional dictation errors.   ----------------------------------------------------------------------------------------------------------------------  Subjective:   CC:   Kevin WARRINERis a 40y.o. male [facility] that was done.  1. All recent blood work that we ordered was reviewed with patient today.  Patient was counseled on all abnormalities and we discussed dietary and lifestyle changes that could help those values (also medications when appropriate).  Extensive health counseling performed and all patient's concerns/ questions were addressed.  - 2.  broke out with viral herpes in genital region last night- fighting cold most of last week- tonsillitis.   Taking 2 whole tabs daily for 5 days. Has plenty of meds, knows to change to once daily after.  3.  Recent URI sx- seen ER-  was discharged and told viral.   Patient then went to his ENT physician a few days later, told had tonsillitis and gave him clindamycin and prednisone which patient took with some relief. Now [name] will be  taking out tonsils Jan 11th.  Patient pleased about this     Wt Readings from Last 3 Encounters:  12/11/15 (!) 342 lb 6.4 oz (155.3 kg)  11/29/15 (!) 330 lb (149.7 kg)  10/16/15 (!) 338 lb  3.2 oz (153.4 kg)   BP Readings from Last 3 Encounters:  12/11/15 125/75  11/29/15 127/72  10/16/15 124/77   Pulse Readings from Last 3 Encounters:  12/11/15 71  11/29/15 72  10/16/15 81   BMI Readings from Last 3 Encounters:  12/11/15 47.42 kg/m  11/29/15 44.76 kg/m  10/16/15 46.84 kg/m     Patient Care Team     Relationship Specialty Notifications Start End  Thomasene Lot, DO PCP - General Family Medicine  11/22/15     Full medical history updated and reviewed in the office today  Patient Active Problem List   Diagnosis Date Noted  . Prediabetes 10/16/2015    Priority: High  . BMI 45.0-49.9, adult ([facility]) 10/16/2015    Priority: High  . Constipation 10/16/2015    Priority: Medium  . GERD (gastroesophageal reflux disease) 10/16/2015    Priority: Medium  . Environmental and seasonal allergies 10/16/2015    Priority: Medium  . Genital herpes 10/16/2015    Priority: Medium  . Vitamin D deficiency 12/11/2015  . Thrombosed external hemorrhoid - right anterior 07/08/2013    Past Medical History:  Diagnosis Date  . Allergic rhinitis   . GERD (gastroesophageal reflux disease)     Past Surgical History:  Procedure Laterality Date  . BREAST SURGERY Right 1997   removal of fatty tissue from right breast.    Social History  Substance Use Topics  . Smoking status: Current Some Day Smoker    Packs/day: 0.50    Years: 15.00    Types: Cigarettes  . Smokeless tobacco: Never Used  . Alcohol use 1.2 oz/week    2 Cans of beer per week    Family Hx: Family History  Problem Relation Age of Onset  . Hypertension Mother   . Bladder Cancer Father   . Cancer Father     bladder  . Cancer Paternal Grandfather     bladder/prostate     Medications: Current Outpatient Prescriptions  Medication Sig Dispense Refill  . clindamycin (CLEOCIN) 300 MG capsule Take 1 capsule by mouth 3 (three) times daily.    . Multiple Vitamin (MULTIVITAMIN WITH MINERALS) TABS tablet Take 1 tablet by mouth daily.    . ranitidine (ZANTAC) 150 MG tablet Take 150 mg by mouth daily as needed.     . valACYclovir (VALTREX) 1000 MG tablet Take 500 mg by mouth daily.  11  . Wheat Dextrin (BENEFIBER DRINK MIX PO) Take 1 Package by mouth daily.    . Cholecalciferol (VITAMIN D3) 5000 units TABS 5,000 IU OTC vitamin D3  daily. 90 tablet 3   No current facility-administered medications for this visit.     Allergies:  Allergies  Allergen Reactions  . Iodinated Diagnostic Agents Itching and Rash    suggested 13 hour prep if cintrast is needed in future/mms  . Amoxicillin     As child   . Biaxin [Clarithromycin] Hives  . Penicillins     Rxn as child "made real sick"     ROS: Review of Systems  Constitutional: Negative.  Negative for chills, diaphoresis, fever, malaise/fatigue and weight loss.  HENT: Negative.  Negative for congestion, sore throat and tinnitus.   Eyes: Negative.  Negative for blurred vision, double vision and photophobia.  Respiratory: Negative.  Negative for cough and wheezing.   Cardiovascular: Negative.  Negative for chest pain and palpitations.  Gastrointestinal: Negative.  Negative for blood in stool, diarrhea, nausea and vomiting.  Genitourinary: Negative.  Negative for dysuria, frequency and  urgency.  Musculoskeletal: Negative.  Negative for joint pain and myalgias.  Skin: Negative.  Negative for itching and rash.  Neurological: Negative.  Negative for dizziness, focal weakness, weakness and headaches.  Endo/Heme/Allergies: Negative.  Negative for environmental allergies and polydipsia. Does not bruise/bleed easily.  Psychiatric/Behavioral: Negative.  Negative for depression and memory loss. The patient is not nervous/anxious and does not have insomnia.    Const:    no fevers, chills Eyes:    conjunctiva clear, no vision changes or blurred vision ENT:  no hearing difficulties, no dysphagia, no dysphonia, no nose bleeds CV:   no chest pain, arrhythmias, no orthopnea, no PND Pulm:   no SOB at rest or exertion, no Wheeze, no DIB, no hemoptysis GI:    no N/V/D/C, no abd pain GU:   no blood in urine or inc freq or urgency Heme/Onc:    no unexplained bleeding, no night sweats, no more fatigue than usual Neuro:   No dizziness, no LOC, No unexplained weakness or numbness Endo:    no unexplained wt loss or gain M-Sk:   no localized myalgias or arthralgias Psych:    No SI/HI, no memory prob or unexplained confusion   Objective:  Blood pressure 125/75, pulse 71, height 5' 11.25" (1.81 m), weight (!) 342 lb 6.4 oz (155.3 kg). Body mass index is 47.42 kg/m. Gen:   Well NAD, A and O *3 HEENT:    [place]/AT, EOMI,  MMM, OP- clr Lungs:   Normal work of breathing. CTA B/L, no Wh, rhonchi Heart:   RRR, S1, S2 WNL's, no MRG Abd:   No gross distention Exts:    warm, pink,  Brisk capillary refill, warm and well perfused.  Psych:    No HI/SI, judgement and insight good, Euthymic mood. Full Affect.   Recent Results (from the past 2160 hour(s))  Urine cytology ancillary only     Status: None   Collection Time: 11/22/15 12:00 AM  Result Value Ref Range   Chlamydia Negative     Comment: Normal Reference Range - Negative   Neisseria gonorrhea Negative     Comment: Normal Reference Range - Negative   Trichomonas Negative     Comment: Normal Reference Range - Negative  Urine cytology ancillary only     Status: None   Collection Time: 11/22/15 12:00 AM  Result Value Ref Range   Bacterial vaginitis Negative for Bacterial Vaginitis Microorganisms     Comment: Normal Reference Range - Negative   Candida vaginitis Negative for Candida Vaginitis Microorganisms     Comment: Normal Reference Range - Negative  HIV antibody     Status: None   Collection Time: 11/22/15  7:50 AM  Result Value Ref Range   HIV 1&2 Ab, 4th Generation NONREACTIVE NONREACTIVE    Comment:   HIV-1 antigen and HIV-1/HIV-2 antibodies were not detected.  There is no laboratory evidence of HIV infection.   HIV-1/2 Antibody Diff        Not indicated. HIV-1 RNA, Qual TMA          Not indicated.     PLEASE NOTE: This information has been disclosed to you from records whose confidentiality may be protected by state law. If your state requires such protection, then the state law prohibits you from making any  further disclosure of the information without the specific written consent of the person to whom it pertains, or as otherwise permitted by law. A general authorization for the release of medical or other information is  NOT sufficient for this purpose.   The performance of this assay has not been clinically validated in patients less than 101 years old.   For additional information please refer to http://education.questdiagnostics.com/faq/FAQ106.  (This link is being provided for informational/educational purposes only.)     RPR     Status: None   Collection Time: 11/22/15  7:50 AM  Result Value Ref Range   RPR Ser Ql NON REAC NON REAC  Hepatitis C antibody     Status: None   Collection Time: 11/22/15  7:50 AM  Result Value Ref Range   HCV Ab NEGATIVE NEGATIVE  CBC with Differential/Platelet     Status: None   Collection Time: 11/22/15  7:50 AM  Result Value Ref Range   WBC 5.4 3.8 - 10.8 K/uL   RBC 4.61 4.20 - 5.80 MIL/uL   Hemoglobin 13.2 13.2 - 17.1 g/dL   HCT 40.2 38.5 - 50.0 %   MCV 87.2 80.0 - 100.0 fL   MCH 28.6 27.0 - 33.0 pg   MCHC 32.8 32.0 - 36.0 g/dL   RDW 14.3 11.0 - 15.0 %   Platelets 283 140 - 400 K/uL   MPV 9.2 7.5 - 12.5 fL   Neutro Abs 3,186 1,500 - 7,800 cells/uL   Lymphs Abs 1,512 850 - 3,900 cells/uL   Monocytes Absolute 540 200 - 950 cells/uL   Eosinophils Absolute 108 15 - 500 cells/uL   Basophils Absolute 54 0 - 200 cells/uL   Neutrophils Relative % 59 %   Lymphocytes Relative 28 %   Monocytes Relative 10 %   Eosinophils Relative 2 %   Basophils Relative 1 %   Smear Review Criteria for review not met   COMPLETE METABOLIC PANEL WITH GFR     Status: Abnormal   Collection Time: 11/22/15  7:50 AM  Result Value Ref Range   Sodium 138 135 - 146 mmol/L   Potassium 4.3 3.5 - 5.3 mmol/L   Chloride 107 98 - 110 mmol/L   CO2 28 20 - 31 mmol/L   Glucose, Bld 115 (H) 65 - 99 mg/dL   BUN 11 7 - 25 mg/dL   Creat 1.04 0.60 - 1.35 mg/dL   Total Bilirubin 0.7  0.2 - 1.2 mg/dL   Alkaline Phosphatase 67 40 - 115 U/L   AST 15 10 - 40 U/L   ALT 17 9 - 46 U/L   Total Protein 6.5 6.1 - 8.1 g/dL   Albumin 4.0 3.6 - 5.1 g/dL   Calcium 8.9 8.6 - 10.3 mg/dL   GFR, Est African American >89 >=60 mL/min   GFR, Est Non African American >89 >=60 mL/min  Hemoglobin A1c     Status: Abnormal   Collection Time: 11/22/15  7:50 AM  Result Value Ref Range   Hgb A1c MFr Bld 5.7 (H) <5.7 %    Comment:   For someone without known diabetes, a hemoglobin A1c value between 5.7% and 6.4% is consistent with prediabetes and should be confirmed with a follow-up test.   For someone with known diabetes, a value <7% indicates that their diabetes is well controlled. A1c targets should be individualized based on duration of diabetes, age, co-morbid conditions and other considerations.   This assay result is consistent with an increased risk of diabetes.   Currently, no consensus exists regarding use of hemoglobin A1c for diagnosis of diabetes in children.      Mean Plasma Glucose 117 mg/dL  Lipid panel     Status:  Abnormal   Collection Time: 11/22/15  7:50 AM  Result Value Ref Range   Cholesterol 169 <200 mg/dL    Comment: ** Please note change in reference range(s). **      Triglycerides 131 <150 mg/dL    Comment: ** Please note change in reference range(s). **      HDL 28 (L) >40 mg/dL    Comment: ** Please note change in reference range(s). **      Total CHOL/HDL Ratio 6.0 (H) <5.0 Ratio   VLDL 26 <30 mg/dL   LDL Cholesterol 115 (H) <100 mg/dL    Comment: ** Please note change in reference range(s). **     T4, free     Status: None   Collection Time: 11/22/15  7:50 AM  Result Value Ref Range   Free T4 1.1 0.8 - 1.8 ng/dL  TSH     Status: None   Collection Time: 11/22/15  7:50 AM  Result Value Ref Range   TSH 2.04 0.40 - 4.50 mIU/L  VITAMIN D 25 Hydroxy (Vit-D Deficiency, Fractures)     Status: Abnormal   Collection Time: 11/22/15  7:50 AM    Result Value Ref Range   Vit D, 25-Hydroxy 24 (L) 30 - 100 ng/mL    Comment: Vitamin D Status           25-OH Vitamin D        Deficiency                <20 ng/mL        Insufficiency         20 - 29 ng/mL        Optimal             > or = 30 ng/mL   For 25-OH Vitamin D testing on patients on D2-supplementation and patients for whom quantitation of D2 and D3 fractions is required, the [facility] 25-OH VIT D, (D2,D3), LC/MS/MS is recommended: order code [id number] (patients > 2 yrs).   Vitamin B12     Status: None   Collection Time: 11/22/15  7:51 AM  Result Value Ref Range   Vitamin B-12 404 200 - 1,100 pg/mL  POCT rapid strep A [facility])     Status: None   Collection Time: 11/29/15  8:10 AM  Result Value Ref Range   Streptococcus, Group A Screen (Direct) NEGATIVE NEGATIVE

## 2015-12-11 NOTE — Assessment & Plan Note (Signed)
Counseling done- advised wt loss.  Use lose it or my fitness pal---> track all food and drinks.   Discussed with patient importance of weight loss to help achieve health goals and how increasing weight, correlates to increasing risk of disease. (or increasing risk of not controlling existing diseases.) 

## 2015-12-11 NOTE — Assessment & Plan Note (Signed)
Advised to take 500 mg daily and then with any acute outbreaks-take the tabs 3 times daily for 5 days-  then back to 1 tab daily for chronic suppression therapy.

## 2015-12-25 ENCOUNTER — Encounter
Admission: RE | Admit: 2015-12-25 | Discharge: 2015-12-25 | Disposition: A | Discharge status: Home / Self Care | Payer: Commercial Managed Care - HMO | Source: Ambulatory Visit | Attending: Otolaryngology | Admitting: Otolaryngology

## 2015-12-25 HISTORY — DX: Sleep apnea, unspecified: G47.30

## 2015-12-25 NOTE — Patient Instructions (Signed)
  Your procedure is scheduled on: 01-18-16 Report to Same Day Surgery 2nd floor medical mall [facility](Medical Mall Entrance-take elevator on left to 2nd floor.  Check in with surgery information desk.) To find out your arrival time please call [phone] between 1PM - 3PM on  01-17-16  Remember: Instructions that are not followed completely may result in serious medical risk, up to and including death, or upon the discretion of your surgeon and anesthesiologist your surgery may need to be rescheduled.    _x___ 1. Do not eat food or drink liquids after midnight. No gum chewing or hard candies.     __x__ 2. No Alcohol for 24 hours before or after surgery.   __x__3. No Smoking for 24 prior to surgery.   ____  4. Bring all medications with you on the day of surgery if instructed.    __x__ 5. Notify your doctor if there is any change in your medical condition     (cold, fever, infections).     Do not wear jewelry, make-up, hairpins, clips or nail polish.  Do not wear lotions, powders, or perfumes. You may wear deodorant.  Do not shave 48 hours prior to surgery. Men may shave face and neck.  Do not bring valuables to the hospital.    [facility] not [facility] for any belongings or valuables.               Contacts, dentures or bridgework may not be worn into surgery.  Leave your suitcase in the car. After surgery it may be brought to your room.  For patients admitted to the hospital, discharge time is determined by your treatment team.   Patients discharged the day of surgery will not be allowed to drive home.  You will need someone to drive you home and stay with you the night of your procedure.    Please read over the following fact sheets that you were given:   [facility] Preparing [facility] and or MRSA Information   _x___ Take these medicines the morning of surgery with A SIP OF WATER:    1. ZANTAC  2. TAKE A ZANTAC ON Wednesday NIGHT BEFORE BED  (01-17-16)  3.  4.  5.  6.  ____Fleets enema or Magnesium Citrate as directed.   ____ Use CHG Soap or sage wipes as directed on instruction sheet   ____ Use inhalers on the day of surgery and bring to hospital day of surgery  ____ Stop metformin 2 days prior to surgery    ____ Take 1/2 of usual insulin dose the night before surgery and none on the morning of  surgery.   ____ Stop Aspirin, Coumadin, Pllavix ,Eliquis, Effient, or Pradaxa  x__ Stop Anti-inflammatories such as Advil, Aleve, Ibuprofen, Motrin, Naproxen,          Naprosyn, Goodies powders or aspirin products. Ok to take Tylenol.   ____ Stop supplements until after surgery.    _X___ Bring C-Pap to the hospital.

## 2016-01-15 ENCOUNTER — Other Ambulatory Visit: Payer: Self-pay

## 2016-01-18 ENCOUNTER — Encounter
Admission: RE | Disposition: A | Discharge status: Home / Self Care | Payer: Self-pay | Source: Ambulatory Visit | Attending: Otolaryngology

## 2016-01-18 ENCOUNTER — Encounter: Payer: Self-pay | Admitting: *Deleted

## 2016-01-18 ENCOUNTER — Ambulatory Visit: Payer: 59 | Admitting: Registered Nurse

## 2016-01-18 ENCOUNTER — Ambulatory Visit
Admission: RE | Admit: 2016-01-18 | Discharge: 2016-01-18 | Disposition: A | Discharge status: Home / Self Care | Payer: 59 | Source: Ambulatory Visit | Attending: Otolaryngology | Admitting: Otolaryngology

## 2016-01-18 DIAGNOSIS — A4289 Other forms of actinomycosis: Secondary | ICD-10-CM | POA: Diagnosis not present

## 2016-01-18 DIAGNOSIS — G4730 Sleep apnea, unspecified: Secondary | ICD-10-CM | POA: Diagnosis not present

## 2016-01-18 DIAGNOSIS — J3501 Chronic tonsillitis: Secondary | ICD-10-CM | POA: Diagnosis not present

## 2016-01-18 DIAGNOSIS — K219 Gastro-esophageal reflux disease without esophagitis: Secondary | ICD-10-CM | POA: Diagnosis not present

## 2016-01-18 DIAGNOSIS — Z87891 Personal history of nicotine dependence: Secondary | ICD-10-CM | POA: Insufficient documentation

## 2016-01-18 DIAGNOSIS — Q386 Other congenital malformations of mouth: Secondary | ICD-10-CM | POA: Diagnosis not present

## 2016-01-18 DIAGNOSIS — G4733 Obstructive sleep apnea (adult) (pediatric): Secondary | ICD-10-CM | POA: Diagnosis not present

## 2016-01-18 DIAGNOSIS — Z79899 Other long term (current) drug therapy: Secondary | ICD-10-CM | POA: Diagnosis not present

## 2016-01-18 DIAGNOSIS — J351 Hypertrophy of tonsils: Secondary | ICD-10-CM | POA: Diagnosis not present

## 2016-01-18 DIAGNOSIS — J039 Acute tonsillitis, unspecified: Secondary | ICD-10-CM | POA: Diagnosis not present

## 2016-01-18 HISTORY — PX: TONSILLECTOMY AND ADENOIDECTOMY: SHX28

## 2016-01-18 SURGERY — TONSILLECTOMY AND ADENOIDECTOMY
Anesthesia: General | Laterality: Bilateral

## 2016-01-18 MED ORDER — FENTANYL CITRATE (PF) 100 MCG/2ML IJ SOLN
50.0000 ug | Freq: Once | INTRAMUSCULAR | Status: AC
  Administered 2016-01-18: 50 ug via INTRAVENOUS

## 2016-01-18 MED ORDER — LIDOCAINE VISCOUS 2 % MT SOLN: 10.0000 mL | Freq: Four times a day (QID) | OROMUCOSAL | 0 refills | Status: DC | PRN

## 2016-01-18 MED ORDER — DEXMEDETOMIDINE HCL 200 MCG/2ML IV SOLN
INTRAVENOUS | Status: DC | PRN
  Administered 2016-01-18: 12 ug via INTRAVENOUS

## 2016-01-18 MED ORDER — OXYCODONE HCL 5 MG/5ML PO SOLN
10.0000 mg | ORAL | Status: DC | PRN
  Administered 2016-01-18: 10 mg via ORAL
  Filled 2016-01-18: qty 10

## 2016-01-18 MED ORDER — SUCCINYLCHOLINE CHLORIDE 200 MG/10ML IV SOSY
PREFILLED_SYRINGE | INTRAVENOUS | Status: AC
  Filled 2016-01-18: qty 10

## 2016-01-18 MED ORDER — MIDAZOLAM HCL 2 MG/2ML IJ SOLN
INTRAMUSCULAR | Status: AC
  Filled 2016-01-18: qty 2

## 2016-01-18 MED ORDER — PROPOFOL 10 MG/ML IV BOLUS
INTRAVENOUS | Status: AC
  Filled 2016-01-18: qty 40

## 2016-01-18 MED ORDER — ONDANSETRON HCL 4 MG/2ML IJ SOLN
INTRAMUSCULAR | Status: DC | PRN
  Administered 2016-01-18: 4 mg via INTRAVENOUS

## 2016-01-18 MED ORDER — HYDROMORPHONE HCL 1 MG/ML IJ SOLN
0.5000 mg | INTRAMUSCULAR | Status: AC | PRN
  Administered 2016-01-18 (×4): 0.5 mg via INTRAVENOUS

## 2016-01-18 MED ORDER — OXYMETAZOLINE HCL 0.05 % NA SOLN
NASAL | Status: AC
  Filled 2016-01-18: qty 15

## 2016-01-18 MED ORDER — PREDNISONE 10 MG (21) PO TBPK: ORAL_TABLET | ORAL | 0 refills | Status: DC

## 2016-01-18 MED ORDER — ONDANSETRON HCL 4 MG PO TABS: 4.0000 mg | ORAL_TABLET | Freq: Three times a day (TID) | ORAL | 0 refills | Status: DC | PRN

## 2016-01-18 MED ORDER — MIDAZOLAM HCL 2 MG/2ML IJ SOLN
INTRAMUSCULAR | Status: DC | PRN
  Administered 2016-01-18: 2 mg via INTRAVENOUS

## 2016-01-18 MED ORDER — PROPOFOL 10 MG/ML IV BOLUS
INTRAVENOUS | Status: AC
  Filled 2016-01-18: qty 20

## 2016-01-18 MED ORDER — FAMOTIDINE 20 MG PO TABS
ORAL_TABLET | ORAL | Status: AC
  Administered 2016-01-18: 20 mg
  Filled 2016-01-18: qty 1

## 2016-01-18 MED ORDER — ROCURONIUM BROMIDE 50 MG/5ML IV SOSY
PREFILLED_SYRINGE | INTRAVENOUS | Status: AC
  Filled 2016-01-18: qty 5

## 2016-01-18 MED ORDER — DEXAMETHASONE SODIUM PHOSPHATE 10 MG/ML IJ SOLN
INTRAMUSCULAR | Status: DC | PRN
  Administered 2016-01-18: 10 mg via INTRAVENOUS

## 2016-01-18 MED ORDER — DEXAMETHASONE SODIUM PHOSPHATE 10 MG/ML IJ SOLN
INTRAMUSCULAR | Status: AC
  Filled 2016-01-18: qty 1

## 2016-01-18 MED ORDER — HYDROMORPHONE HCL 1 MG/ML IJ SOLN
INTRAMUSCULAR | Status: AC
  Filled 2016-01-18: qty 1

## 2016-01-18 MED ORDER — SUCCINYLCHOLINE CHLORIDE 20 MG/ML IJ SOLN
INTRAMUSCULAR | Status: DC | PRN
  Administered 2016-01-18: 150 mg via INTRAVENOUS

## 2016-01-18 MED ORDER — LACTATED RINGERS IV SOLN
INTRAVENOUS | Status: DC
  Administered 2016-01-18: 09:00:00 via INTRAVENOUS
  Administered 2016-01-18: 50 mL/h via INTRAVENOUS

## 2016-01-18 MED ORDER — BUPIVACAINE HCL 0.5 % IJ SOLN
INTRAMUSCULAR | Status: DC | PRN
  Administered 2016-01-18: 1 mL

## 2016-01-18 MED ORDER — OXYMETAZOLINE HCL 0.05 % NA SOLN
NASAL | Status: DC | PRN
  Administered 2016-01-18: 1

## 2016-01-18 MED ORDER — FENTANYL CITRATE (PF) 100 MCG/2ML IJ SOLN
INTRAMUSCULAR | Status: AC
  Filled 2016-01-18: qty 2

## 2016-01-18 MED ORDER — BUPIVACAINE HCL (PF) 0.5 % IJ SOLN
INTRAMUSCULAR | Status: AC
  Filled 2016-01-18: qty 30

## 2016-01-18 MED ORDER — FENTANYL CITRATE (PF) 100 MCG/2ML IJ SOLN
25.0000 ug | INTRAMUSCULAR | Status: AC | PRN
  Administered 2016-01-18 (×6): 25 ug via INTRAVENOUS

## 2016-01-18 MED ORDER — SUGAMMADEX SODIUM 200 MG/2ML IV SOLN
INTRAVENOUS | Status: AC
  Filled 2016-01-18: qty 2

## 2016-01-18 MED ORDER — FENTANYL CITRATE (PF) 250 MCG/5ML IJ SOLN
INTRAMUSCULAR | Status: AC
  Filled 2016-01-18: qty 5

## 2016-01-18 MED ORDER — LIDOCAINE 2% (20 MG/ML) 5 ML SYRINGE
INTRAMUSCULAR | Status: DC | PRN
  Administered 2016-01-18: 100 mg via INTRAVENOUS

## 2016-01-18 MED ORDER — OXYCODONE HCL 5 MG/5ML PO SOLN
ORAL | Status: AC
  Administered 2016-01-18: 10 mg via ORAL
  Filled 2016-01-18: qty 10

## 2016-01-18 MED ORDER — ONDANSETRON HCL 4 MG/2ML IJ SOLN: 4.0000 mg | Freq: Once | INTRAMUSCULAR | Status: DC | PRN

## 2016-01-18 MED ORDER — OXYCODONE HCL 5 MG/5ML PO SOLN: 10.0000 mg | ORAL | 0 refills | Status: DC | PRN

## 2016-01-18 MED ORDER — LIDOCAINE 2% (20 MG/ML) 5 ML SYRINGE
INTRAMUSCULAR | Status: AC
  Filled 2016-01-18: qty 5

## 2016-01-18 MED ORDER — SUGAMMADEX SODIUM 200 MG/2ML IV SOLN
INTRAVENOUS | Status: DC | PRN
  Administered 2016-01-18: 299.4 mg via INTRAVENOUS

## 2016-01-18 MED ORDER — DOCUSATE SODIUM 100 MG PO CAPS: 100.0000 mg | ORAL_CAPSULE | Freq: Two times a day (BID) | ORAL | 0 refills | Status: DC

## 2016-01-18 MED ORDER — PROPOFOL 10 MG/ML IV BOLUS
INTRAVENOUS | Status: DC | PRN
  Administered 2016-01-18: 200 mg via INTRAVENOUS

## 2016-01-18 MED ORDER — FENTANYL CITRATE (PF) 100 MCG/2ML IJ SOLN
INTRAMUSCULAR | Status: DC | PRN
  Administered 2016-01-18: 100 ug via INTRAVENOUS
  Administered 2016-01-18: 50 ug via INTRAVENOUS

## 2016-01-18 MED ORDER — ROCURONIUM BROMIDE 100 MG/10ML IV SOLN
INTRAVENOUS | Status: DC | PRN
  Administered 2016-01-18: 10 mg via INTRAVENOUS
  Administered 2016-01-18: 30 mg via INTRAVENOUS

## 2016-01-18 MED ORDER — ONDANSETRON HCL 4 MG/2ML IJ SOLN
INTRAMUSCULAR | Status: AC
  Filled 2016-01-18: qty 2

## 2016-01-18 SURGICAL SUPPLY — 16 items
BLADE BOVIE TIP EXT 4 (BLADE) ×2 IMPLANT
CANISTER SUCT 1200ML W/VALVE (MISCELLANEOUS) ×2 IMPLANT
CATH ROBINSON RED A/P 10FR (CATHETERS) ×2 IMPLANT
CATH ROBINSON RED A/P 12FR (CATHETERS) ×1 IMPLANT
COAG SUCT 10F 3.5MM HAND CTRL (MISCELLANEOUS) ×2 IMPLANT
ELECT REM PT RETURN 9FT ADLT (ELECTROSURGICAL) ×2
ELECTRODE REM PT RTRN 9FT ADLT (ELECTROSURGICAL) ×1 IMPLANT
GLOVE BIO SURGEON STRL SZ7.5 (GLOVE) ×2 IMPLANT
HANDLE SUCTION POOLE (INSTRUMENTS) ×1 IMPLANT
KIT RM TURNOVER STRD PROC AR (KITS) ×2 IMPLANT
NS IRRIG 500ML POUR BTL (IV SOLUTION) ×2 IMPLANT
PACK HEAD/NECK (MISCELLANEOUS) ×2 IMPLANT
SPONGE TONSIL 1 RF SGL (DISPOSABLE) ×2 IMPLANT
SUCTION POOLE HANDLE (INSTRUMENTS) ×2
SUT VIC AB 4-0 RB1 18 (SUTURE) ×2 IMPLANT
SYR 3ML LL SCALE MARK (SYRINGE) ×2 IMPLANT

## 2016-01-18 NOTE — OR Nursing (Signed)
1305. Dr. Priscella MannPenwarden stated we could give more fentanyl if the patient wants. Consulted the patient and he opted to go home.  Ice applied to throat and encouraged not to drink but just let ice melt in his mouth.  As soon as he gets home he will use the lidocaine and will repeat the Roxicodone as soon as it is time

## 2016-01-18 NOTE — Discharge Instructions (Signed)

## 2016-01-18 NOTE — H&P (Signed)
..  History and Physical paper copy reviewed and updated date of procedure and will be scanned into system.  

## 2016-01-18 NOTE — Transfer of Care (Signed)
Immediate Anesthesia Transfer of Care Note  Patient: Kevin Michael  Procedure(s) Performed: Procedure(s): TONSILLECTOMY (Bilateral)  Patient Location: PACU  Anesthesia Type:General  Level of Consciousness: awake, alert  and oriented  Airway & Oxygen Therapy: Patient Spontanous Breathing and Patient connected to face mask oxygen  Post-op Assessment: Report given to RN and Post -op Vital signs reviewed and stable  Post vital signs: Reviewed and stable  Last Vitals:  Vitals:   01/18/16 0745  BP: 113/61  Pulse: 69  Resp: 16  Temp: 36.2 C    Last Pain:  Vitals:   01/18/16 0745  TempSrc: Tympanic  PainSc: 2       Patients Stated Pain Goal: 0 (01/18/16 0745)  Complications: No apparent anesthesia complications

## 2016-01-18 NOTE — Anesthesia Procedure Notes (Signed)
Procedure Name: Intubation Date/Time: 01/18/2016 9:10 AM Performed by: Paulette BlanchPARAS, Kourosh Jablonsky Pre-anesthesia Checklist: Patient identified, Patient being monitored, Timeout performed, Emergency Drugs available and Suction available Patient Re-evaluated:Patient Re-evaluated prior to inductionOxygen Delivery Method: Circle system utilized Preoxygenation: Pre-oxygenation with 100% oxygen Intubation Type: IV induction Ventilation: Mask ventilation without difficulty Laryngoscope Size: 3 and Miller Grade View: Grade I Tube type: Oral Tube size: 7.0 mm Number of attempts: 1 Airway Equipment and Method: Stylet Placement Confirmation: ETT inserted through vocal cords under direct vision,  positive ETCO2 and breath sounds checked- equal and bilateral Secured at: 21 cm Tube secured with: Tape Dental Injury: Teeth and Oropharynx as per pre-operative assessment

## 2016-01-18 NOTE — Op Note (Signed)
..  01/18/2016  9:56 AM    [name], Kevin Michael  [id number]   Pre-Op WU:[id number]:chronic tonsillitis, tonsillar hypertrophy, OSA  Post-op Dx: tonsillitis, tonsillar hypertrophy, OSA  Proc:Tonsillectomy > age 39  Surg: [facility]  Anes:  General Endotracheal  EBL:  <1910ml  Comp:  None  Findings:  3+ cryptic and erythematous tonsils with fibrotic scar to underlying muscle L>R.  Elongated uvula touching epiglottis trimmed for airway protection.  Procedure: After the patient was identified in holding and the history and physical and consent was reviewed, the patient was taken to the operating room and placed in a supine position.  General endotracheal anesthesia was induced in the normal fashion.  At this time, the patient was rotated 45 degrees and a shoulder roll was placed.  At this time, a McIvor mouthgag was inserted into the patient's oral cavity and suspended from the Mayo stand without injury to teeth, lips, or gums.  Next a red rubber catheter was inserted into the patient left nostril for retraction of the uvula and soft palate superiorly.  Next a curved Alice clamp was attached to the patient's right superior tonsillar pole and retracted medially and inferiorly.  A Bovie electrocautery was used to dissect the patient's right tonsil in a subcapsular plane.  Meticulous hemostasis was achieved with Bovie suction cautery.  At this time, the mouth gag was released from suspension for 1 minute.  Attention now was directed to the patient's left side.  In a similar fashion the curved Alice clamp was attached to the superior pole and this was retracted medially and inferiorly and the tonsil was excised in a subcapsular plane with Bovie electrocautery.  After completion of the second tonsil, meticulous hemostasis was continued.  At this time, attention was directed to the patient's Adenoids.  Under indirect visualization using an operating mirror, the adenoid tissue was visualized and noted to be  absent in nature.    At this time, the anterior and posterior tonsillar pillars were closed in a fish mouth fashion to ensure improvement in airway diameter upon extubation and healing.  This was closed with intermittent 4.0 Vicryl sutures.  The Uvula at this time was noted to be significantly enlarged and elongated and touching the epiglottis.  Due to concern of airway compromise, the uvula was trimmed 2/3 of the way in an angled fashion and the mucosa closed with intermittent vicryl sutures.  At this time, the patient's nasal cavity and oral cavity was irrigated with sterile saline.  One cc of 0.5% Marcaine was injected into the anterior and posterior tonsillar fossa bilaterally.  The patient was released from suspension at this time for 1 minute and then his oral cavity observed for good hemostasis bilaterally.  Following this  The care of patient was returned to anesthesia, awakened, and transferred to recovery in stable condition.  Dispo:  PACU to home  Plan: Soft diet.  Limit exercise and strenuous activity for 2 weeks.  Fluid hydration  Recheck my office three weeks.  Use CPAP machine.   [name] 9:56 AM 01/18/2016

## 2016-01-18 NOTE — Anesthesia Preprocedure Evaluation (Signed)
Anesthesia Evaluation  Patient identified by MRN, date of birth, ID band Patient awake    Reviewed: Allergy & Precautions, H&P , NPO status , Patient's Chart, lab work & pertinent test results, reviewed documented beta blocker date and time   Airway Mallampati: II  TM Distance: >3 FB Neck ROM: full    Dental  (+) Teeth Intact   Pulmonary neg pulmonary ROS, sleep apnea and Continuous Positive Airway Pressure Ventilation , former smoker,    Pulmonary exam normal        Cardiovascular negative cardio ROS Normal cardiovascular exam Rhythm:regular Rate:Normal     Neuro/Psych negative neurological ROS  negative psych ROS   GI/Hepatic negative GI ROS, Neg liver ROS, GERD  Medicated,  Endo/Other  negative endocrine ROSMorbid obesity  Renal/GU negative Renal ROS  negative genitourinary   Musculoskeletal   Abdominal   Peds  Hematology negative hematology ROS (+)   Anesthesia Other Findings Past Medical History: No date: Allergic rhinitis No date: GERD (gastroesophageal reflux disease) No date: Sleep apnea     Comment: CPAP Past Surgical History: 1997: BREAST SURGERY Right     Comment: removal of fatty tissue from right breast.   Reproductive/Obstetrics negative OB ROS                             Anesthesia Physical Anesthesia Plan  ASA: III  Anesthesia Plan: General ETT   Post-op Pain Management:    Induction:   Airway Management Planned:   Additional Equipment:   Intra-op Plan:   Post-operative Plan:   Informed Consent: I have reviewed the patients History and Physical, chart, labs and discussed the procedure including the risks, benefits and alternatives for the proposed anesthesia with the patient or authorized representative who has indicated his/her understanding and acceptance.   Dental Advisory Given  Plan Discussed with: CRNA  Anesthesia Plan Comments:          Anesthesia Quick Evaluation

## 2016-01-19 ENCOUNTER — Other Ambulatory Visit: Payer: Self-pay | Admitting: Family Medicine

## 2016-01-19 LAB — SURGICAL PATHOLOGY

## 2016-01-19 NOTE — Anesthesia Postprocedure Evaluation (Signed)
Anesthesia Post Note  Patient: Kevin Michael  Procedure(s) Performed: Procedure(s) (LRB): TONSILLECTOMY (Bilateral)  Patient location during evaluation: PACU Anesthesia Type: General Level of consciousness: awake and alert Pain management: pain level controlled Vital Signs Assessment: post-procedure vital signs reviewed and stable Respiratory status: spontaneous breathing, nonlabored ventilation, respiratory function stable and patient connected to nasal cannula oxygen Cardiovascular status: blood pressure returned to baseline and stable Postop Assessment: no signs of nausea or vomiting Anesthetic complications: no     Last Vitals:  Vitals:   01/18/16 1202 01/18/16 1253  BP: 125/75 129/76  Pulse: 77 (!) 52  Resp: 16 16  Temp: 36.1 C     Last Pain:  Vitals:   01/19/16 0839  TempSrc:   PainSc: 8                  Yevette EdwardsJames G Adams

## 2016-03-24 DIAGNOSIS — J019 Acute sinusitis, unspecified: Secondary | ICD-10-CM | POA: Diagnosis not present

## 2016-04-08 DIAGNOSIS — J301 Allergic rhinitis due to pollen: Secondary | ICD-10-CM | POA: Diagnosis not present

## 2016-04-08 DIAGNOSIS — K120 Recurrent oral aphthae: Secondary | ICD-10-CM | POA: Diagnosis not present

## 2016-04-10 DIAGNOSIS — K120 Recurrent oral aphthae: Secondary | ICD-10-CM | POA: Diagnosis not present

## 2016-05-28 DIAGNOSIS — K120 Recurrent oral aphthae: Secondary | ICD-10-CM | POA: Diagnosis not present

## 2016-07-15 DIAGNOSIS — R070 Pain in throat: Secondary | ICD-10-CM | POA: Diagnosis not present

## 2016-07-15 DIAGNOSIS — G4733 Obstructive sleep apnea (adult) (pediatric): Secondary | ICD-10-CM | POA: Diagnosis not present

## 2016-07-15 DIAGNOSIS — Z9989 Dependence on other enabling machines and devices: Secondary | ICD-10-CM | POA: Diagnosis not present

## 2016-07-15 DIAGNOSIS — K148 Other diseases of tongue: Secondary | ICD-10-CM | POA: Diagnosis not present

## 2016-07-18 ENCOUNTER — Other Ambulatory Visit: Payer: Self-pay | Admitting: Otolaryngology

## 2016-07-18 DIAGNOSIS — K219 Gastro-esophageal reflux disease without esophagitis: Secondary | ICD-10-CM

## 2016-07-22 ENCOUNTER — Ambulatory Visit
Admission: RE | Admit: 2016-07-22 | Discharge: 2016-07-22 | Disposition: A | Discharge status: Home / Self Care | Payer: 59 | Source: Ambulatory Visit | Attending: Otolaryngology | Admitting: Otolaryngology

## 2016-07-22 DIAGNOSIS — K219 Gastro-esophageal reflux disease without esophagitis: Secondary | ICD-10-CM | POA: Diagnosis not present

## 2016-09-02 DIAGNOSIS — K219 Gastro-esophageal reflux disease without esophagitis: Secondary | ICD-10-CM | POA: Diagnosis not present

## 2016-10-25 DIAGNOSIS — K219 Gastro-esophageal reflux disease without esophagitis: Secondary | ICD-10-CM | POA: Diagnosis not present

## 2016-10-25 DIAGNOSIS — K449 Diaphragmatic hernia without obstruction or gangrene: Secondary | ICD-10-CM | POA: Diagnosis not present

## 2017-02-03 DIAGNOSIS — J209 Acute bronchitis, unspecified: Secondary | ICD-10-CM | POA: Diagnosis not present

## 2017-06-11 DIAGNOSIS — L720 Epidermal cyst: Secondary | ICD-10-CM | POA: Diagnosis not present

## 2017-10-02 ENCOUNTER — Emergency Department
Admission: EM | Admit: 2017-10-02 | Discharge: 2017-10-02 | Disposition: A | Discharge status: Home / Self Care | Payer: 59 | Attending: Emergency Medicine | Admitting: Emergency Medicine

## 2017-10-02 ENCOUNTER — Encounter: Payer: Self-pay | Admitting: Emergency Medicine

## 2017-10-02 ENCOUNTER — Other Ambulatory Visit: Payer: Self-pay

## 2017-10-02 DIAGNOSIS — Z79899 Other long term (current) drug therapy: Secondary | ICD-10-CM | POA: Diagnosis not present

## 2017-10-02 DIAGNOSIS — S6991XA Unspecified injury of right wrist, hand and finger(s), initial encounter: Secondary | ICD-10-CM | POA: Diagnosis present

## 2017-10-02 DIAGNOSIS — Y929 Unspecified place or not applicable: Secondary | ICD-10-CM | POA: Diagnosis not present

## 2017-10-02 DIAGNOSIS — Y998 Other external cause status: Secondary | ICD-10-CM | POA: Insufficient documentation

## 2017-10-02 DIAGNOSIS — W290XXA Contact with powered kitchen appliance, initial encounter: Secondary | ICD-10-CM | POA: Diagnosis not present

## 2017-10-02 DIAGNOSIS — Y9389 Activity, other specified: Secondary | ICD-10-CM | POA: Insufficient documentation

## 2017-10-02 DIAGNOSIS — F17210 Nicotine dependence, cigarettes, uncomplicated: Secondary | ICD-10-CM | POA: Diagnosis not present

## 2017-10-02 DIAGNOSIS — S61101A Unspecified open wound of right thumb with damage to nail, initial encounter: Secondary | ICD-10-CM | POA: Diagnosis not present

## 2017-10-02 DIAGNOSIS — S61209A Unspecified open wound of unspecified finger without damage to nail, initial encounter: Secondary | ICD-10-CM

## 2017-10-02 MED ORDER — TRAMADOL HCL 50 MG PO TABS: 50.0000 mg | ORAL_TABLET | Freq: Four times a day (QID) | ORAL | 0 refills | Status: DC | PRN

## 2017-10-02 MED ORDER — CEPHALEXIN 500 MG PO CAPS: 500.0000 mg | ORAL_CAPSULE | Freq: Three times a day (TID) | ORAL | 0 refills | Status: DC

## 2017-10-02 MED ORDER — LIDOCAINE HCL (PF) 1 % IJ SOLN
5.0000 mL | Freq: Once | INTRAMUSCULAR | Status: AC
Start: 2017-10-02 — End: 2017-10-02
  Administered 2017-10-02: 5 mL via INTRADERMAL
  Filled 2017-10-02: qty 5

## 2017-10-02 MED ORDER — TETANUS-DIPHTH-ACELL PERTUSSIS 5-2.5-18.5 LF-MCG/0.5 IM SUSP: 0.5000 mL | Freq: Once | INTRAMUSCULAR | Status: DC

## 2017-10-02 NOTE — ED Provider Notes (Signed)
[facility] Emergency Department Provider Note  ____________________________________________   First MD Initiated Contact with Patient 10/02/17 1806     (approximate)  I have reviewed the triage vital signs and the nursing notes.   HISTORY  Chief Complaint Laceration    HPI Kevin Michael is a 42 y.o. male presents emergency department complaining of a fingertip avulsion due to the slicer on a mandolin.  He states his tetanus is up-to-date as he had one 2 years ago.    Past Medical History:  Diagnosis Date  . Allergic rhinitis   . GERD (gastroesophageal reflux disease)   . Sleep apnea    CPAP    Patient Active Problem List   Diagnosis Date Noted  . Vitamin D deficiency 12/11/2015  . Prediabetes 10/16/2015  . Constipation 10/16/2015  . GERD (gastroesophageal reflux disease) 10/16/2015  . Environmental and seasonal allergies 10/16/2015  . BMI 45.0-49.9, adult (HCC) 10/16/2015  . Genital herpes 10/16/2015  . Thrombosed external hemorrhoid - right anterior 07/08/2013    Past Surgical History:  Procedure Laterality Date  . BREAST SURGERY Right 1997   removal of fatty tissue from right breast.  . TONSILLECTOMY AND ADENOIDECTOMY Bilateral 01/18/2016   Procedure: TONSILLECTOMY;  Surgeon: Bud Face, MD;  Location: [facility] ORS;  Service: ENT;  Laterality: Bilateral;    Prior to Admission medications   Medication Sig Start Date End Date Taking? Authorizing Provider  cephALEXin (KEFLEX) 500 MG capsule Take 1 capsule (500 mg total) by mouth 3 (three) times daily. 10/02/17   Burr Soffer, Roselyn Bering, PA-C  Cholecalciferol (VITAMIN D3) 5000 units TABS 5,000 IU OTC vitamin D3 daily. 12/11/15   Opalski, Gavin Pound, DO  docusate sodium (COLACE) 100 MG capsule Take 1 capsule (100 mg total) by mouth 2 (two) times daily. 01/18/16   Vaught, Roney Mans, MD  lidocaine (XYLOCAINE) 2 % solution Use as directed 10 mLs in the mouth or throat every 6 (six) hours as needed for  mouth pain (Swish and spit). 01/18/16   Bud Face, MD  Multiple Vitamin (MULTIVITAMIN WITH MINERALS) TABS tablet Take 1 tablet by mouth daily.    [provider]  ranitidine (ZANTAC) 150 MG tablet Take 150 mg by mouth daily as needed.     [provider]  traMADol (ULTRAM) 50 MG tablet Take 1 tablet (50 mg total) by mouth every 6 (six) hours as needed. 10/02/17   Phuong Hillary, Roselyn Bering, PA-C  valACYclovir (VALTREX) 1000 MG tablet Take 500 mg by mouth daily. 10/07/15   [provider]  Wheat Dextrin (BENEFIBER DRINK MIX PO) Take 1 Package by mouth daily.    [provider]    Allergies Iodinated diagnostic agents; Biaxin [clarithromycin]; Amoxicillin; and Penicillins  Family History  Problem Relation Age of Onset  . Hypertension Mother   . Bladder Cancer Father   . Cancer Father        bladder  . Cancer Paternal Grandfather        bladder/prostate    Social History Social History   Tobacco Use  . Smoking status: Current Some Day Smoker    Packs/day: 0.25    Years: 15.00    Pack years: 3.75    Types: Cigarettes    Last attempt to quit: 10/25/2015    Years since quitting: 1.9  . Smokeless tobacco: Never Used  Substance Use Topics  . Alcohol use: Yes    Alcohol/week: 2.0 standard drinks    Types: 2 Cans of beer per week  Comment: OCC  . Drug use: No    Review of Systems  Constitutional: No fever/chills Eyes: No visual changes. ENT: No sore throat. Respiratory: Denies cough Genitourinary: Negative for dysuria. Musculoskeletal: Negative for back pain.  Fingertip avulsion Skin: Negative for rash.    ____________________________________________   PHYSICAL EXAM:  VITAL SIGNS: ED Triage Vitals  Enc Vitals Group     BP 10/02/17 1818 (!) 148/78     Pulse Rate 10/02/17 1818 84     Resp 10/02/17 1818 18     Temp 10/02/17 1818 (!) 97 F (36.1 C)     Temp Source 10/02/17 1818 Oral     SpO2 10/02/17 1818 98 %     Weight 10/02/17  1817 (!) 434 lb 4.9 oz (197 kg)     Height --      Head Circumference --      Peak Flow --      Pain Score 10/02/17 1811 4     Pain Loc --      Pain Edu? --      Excl. in GC? --     Constitutional: Alert and oriented. Well appearing and in no acute distress. Eyes: Conjunctivae are normal.  Head: Atraumatic. Nose: No congestion/rhinnorhea. Mouth/Throat: Mucous membranes are moist.   Neck:  supple no lymphadenopathy noted Cardiovascular: Normal rate, regular rhythm.  Respiratory: Normal respiratory effort.  No retractions GU: deferred Musculoskeletal: FROM all extremities, warm and well perfused.  Positive for a avulsion of the distal thumb.  Part of the nail is involved.  Area is actively bleeding. Neurologic:  Normal speech and language.  Skin:  Skin is warm, dry.  Positive for skin avulsion of the right thumb.. No rash noted. Psychiatric: Mood and affect are normal. Speech and behavior are normal.  ____________________________________________   LABS (all labs ordered are listed, but only abnormal results are displayed)  Labs Reviewed - No data to display ____________________________________________   ____________________________________________  RADIOLOGY    ____________________________________________   PROCEDURES  Procedure(s) performed: Multiple layers of Surgicel were applied.  The hand was elevated and the bleeding was controlled.  A dressing was applied.  Procedures    ____________________________________________   INITIAL IMPRESSION / ASSESSMENT AND PLAN / ED COURSE  Pertinent labs & imaging results that were available during my care of the patient were reviewed by me and considered in my medical decision making (see chart for details).   Patient is a 42 year old male presents emergency department complaining of a fingertip avulsion due to cutting it on a mandolin.  Physical exam the right thumb has an avulsion of the distal thumb with no bone  involvement.  As it is superficial.  Part of the nail is avulsed.  Surgicel and a nonstick dressing were applied.  Bleeding was controlled.  Patient was given a sling to keep the hand elevated.  Prescription for Keflex and tramadol as needed.  He was given a work note for tomorrow.  Follow-up with orthopedics next week.  He states he understands will comply.  Was discharged in stable condition.     As part of my medical decision making, I reviewed the following data within the electronic MEDICAL RECORD NUMBER Nursing notes reviewed and incorporated, Notes from prior ED visits and [place] Controlled Substance Database  ____________________________________________   FINAL CLINICAL IMPRESSION(S) / ED DIAGNOSES  Final diagnoses:  Avulsion of finger tip, initial encounter      NEW MEDICATIONS STARTED DURING THIS VISIT:  New Prescriptions   CEPHALEXIN (  KEFLEX) 500 MG CAPSULE    Take 1 capsule (500 mg total) by mouth 3 (three) times daily.   TRAMADOL (ULTRAM) 50 MG TABLET    Take 1 tablet (50 mg total) by mouth every 6 (six) hours as needed.     Note:  This document was prepared using Dragon voice recognition software and may include unintentional dictation errors.    Faythe Ghee, PA-C 10/02/17 2019    Dionne Bucy, MD 10/03/17 [phone]

## 2017-10-02 NOTE — Discharge Instructions (Addendum)
Follow-up with the orthopedic of your choice.  [facility] in [place] has a walk-in clinic if you have any concerns.  Change the dressing in 24 hours.  You may trim back the Surgicel as it lifts up.  Take the medications as prescribed.  Keflex to prevent infection and tramadol is for pain as needed.  He may also take Tylenol or ibuprofen.

## 2017-10-02 NOTE — ED Triage Notes (Signed)
States he was using a mandolin slicer laceration noted to right thumb

## 2018-01-30 IMAGING — RF DG ESOPHAGUS
6 of 7 series · 14 of 19 positions shown · non-contrast
Comparison: None.

CLINICAL DATA: Gastroesophageal reflux disease.

EXAM:
ESOPHOGRAM / BARIUM SWALLOW / BARIUM TABLET STUDY
TECHNIQUE: Combined double contrast and single contrast examination performed
using effervescent crystals, thick barium liquid, and thin barium
liquid. The patient was observed with fluoroscopy swallowing a 13 mm
barium sulphate tablet.
FLUOROSCOPY TIME:  Fluoroscopy Time:  1 minutes 30 seconds
Radiation Exposure Index (if provided by the fluoroscopic device):
154 mGy

[Series 1: sequence · 3 of 21 frames shown (1 of 4)]
[frame 4/21]
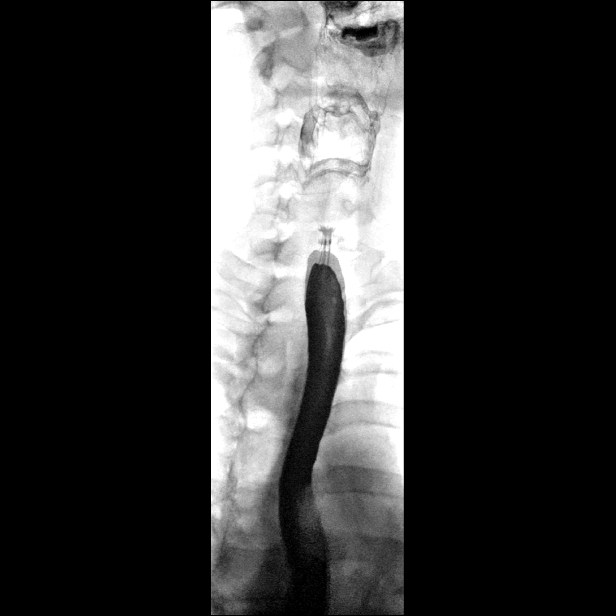
[frame 14/21]
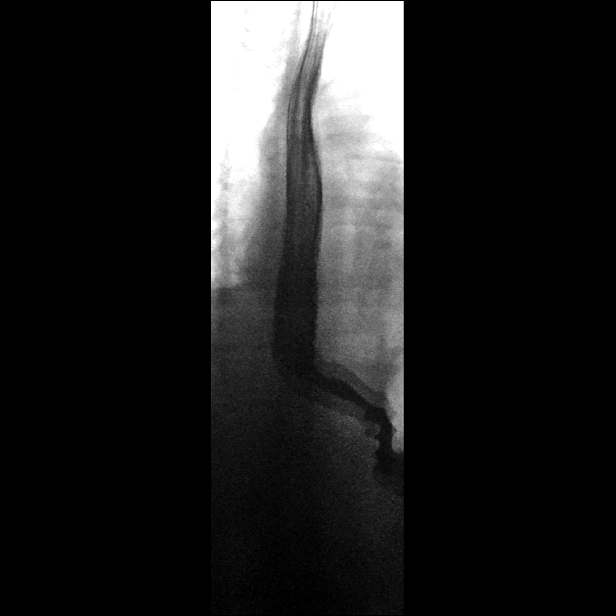
[frame 18/21]
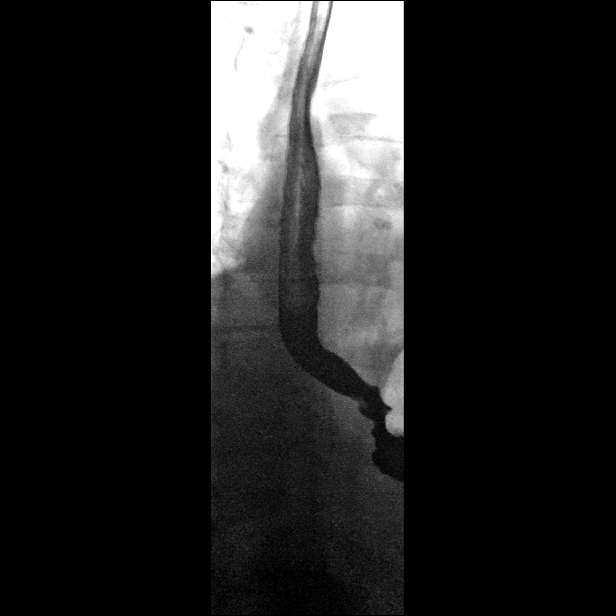

[Series 2: one shot · 1 of 1 slices shown (1 of 2)]
[im 1/1]
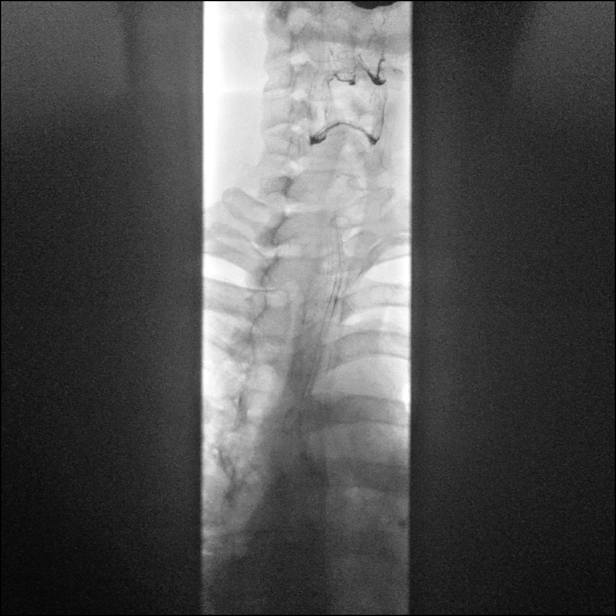

[Series 3: sequence · 3 of 25 frames shown (2 of 4)]
[frame 10/25]
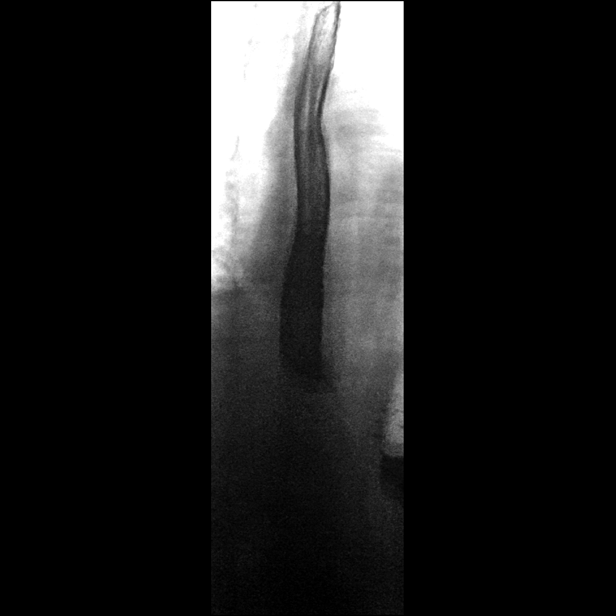
[frame 13/25]
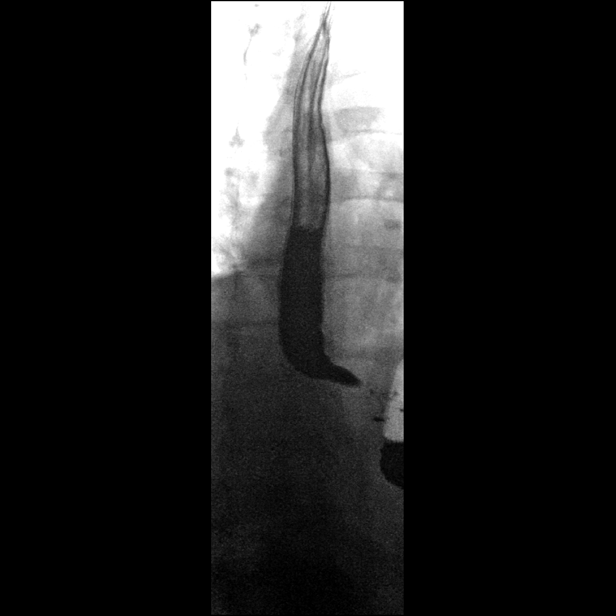
[frame 22/25]
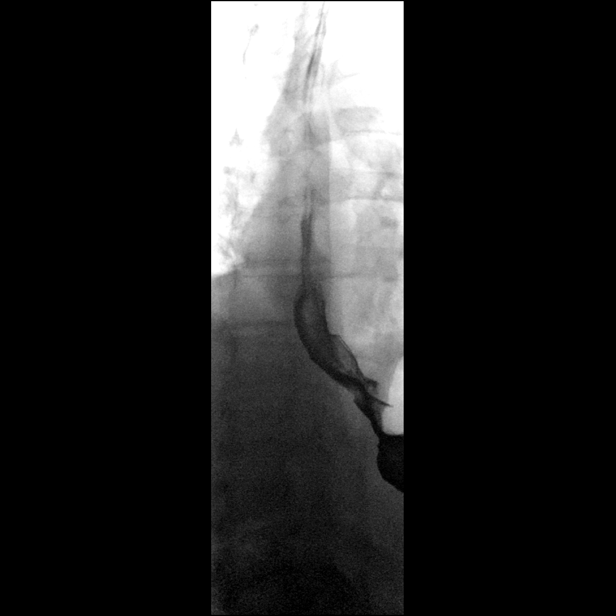

[Series 5: sequence · 3 of 34 frames shown (3 of 4)]
[frame 6/34]
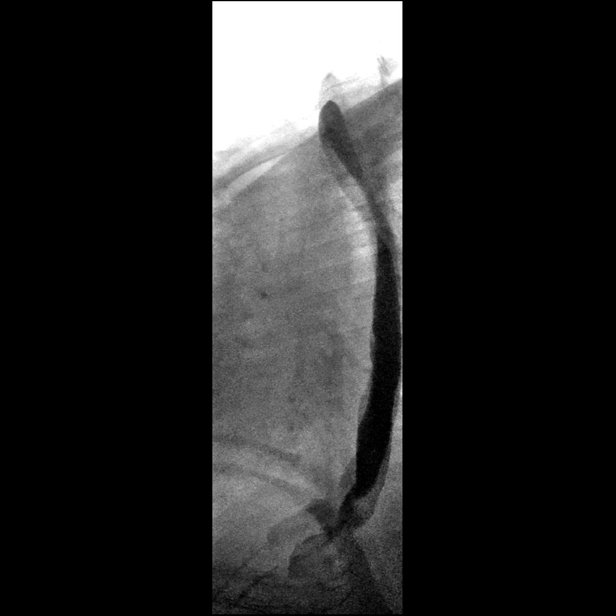
[frame 7/34]
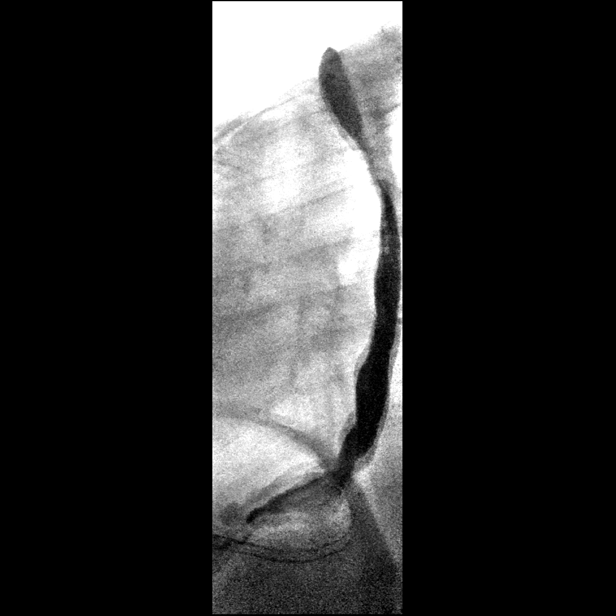
[frame 18/34]
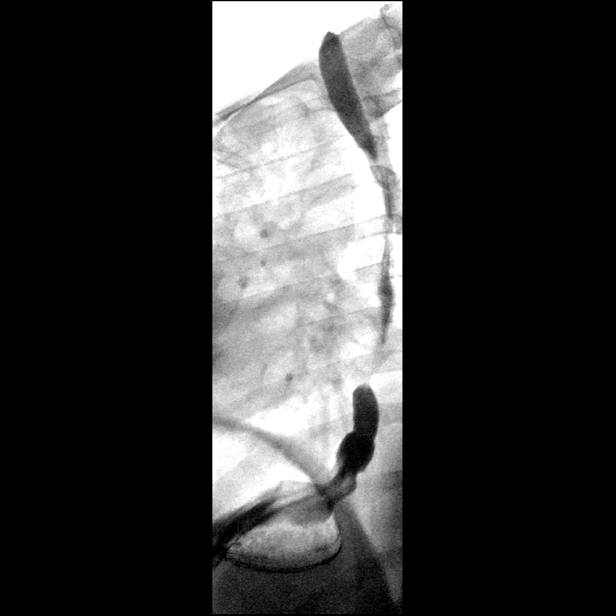

[Series 6: one shot · 1 of 1 slices shown (2 of 2)]
[im 1/1]
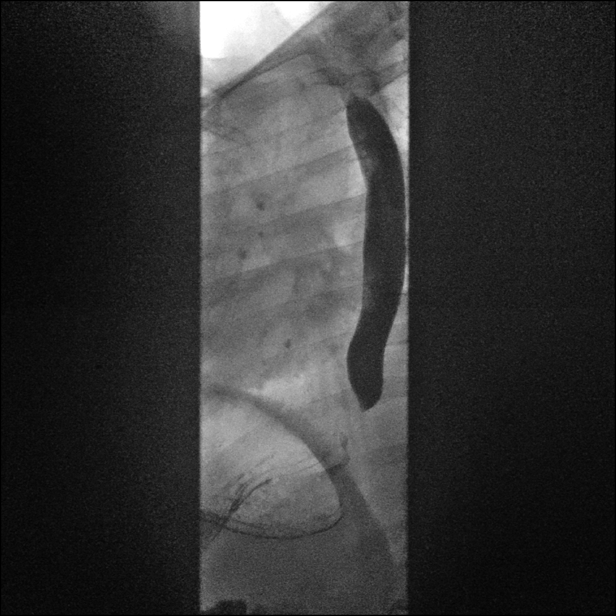

[Series 7: sequence · 3 of 34 frames shown (4 of 4)]
[frame 2/34]
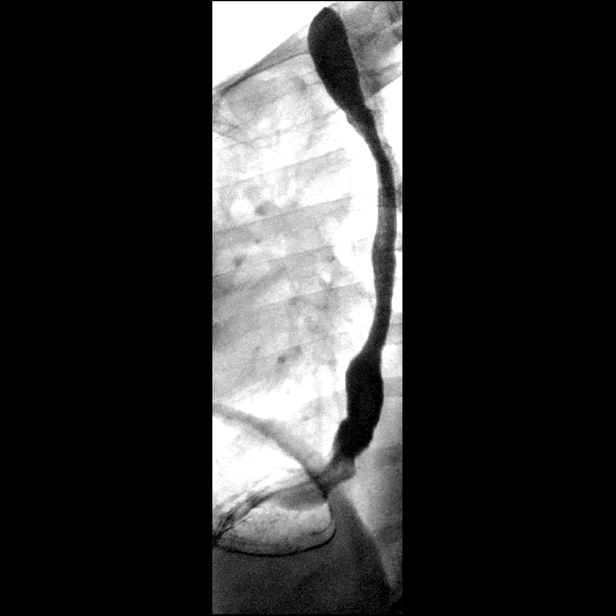
[frame 6/34]
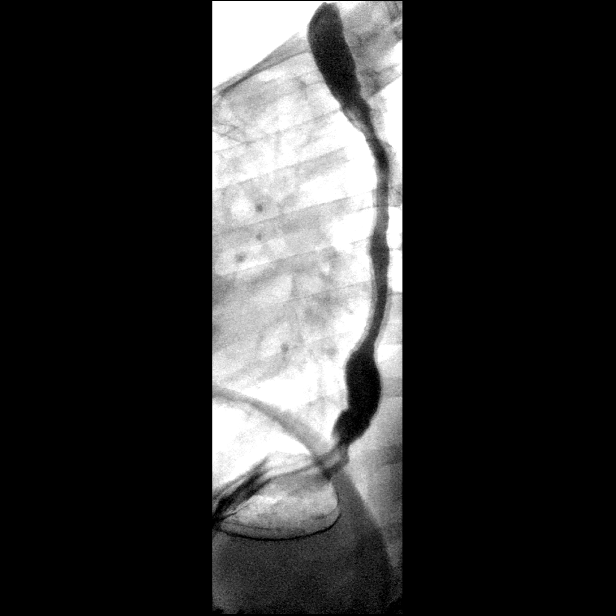
[frame 29/34]
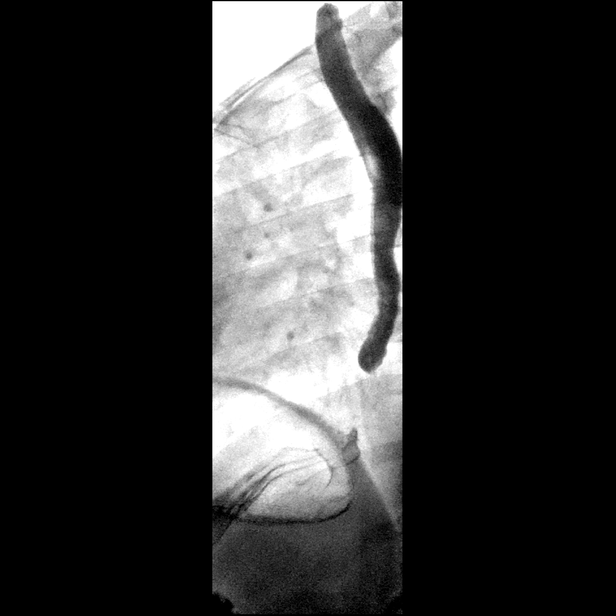

[14 of 19 positions shown; findings below may reference images not displayed]

FINDINGS: The mucosa and motility of the esophagus are normal. There is a tiny
sliding hiatal hernia seen only with Valsalva maneuver. There is no
esophagitis or stricture. A 13 mm barium tablet passed immediately
from the mouth to the stomach with no delay.
IMPRESSION: Tiny sliding hiatal hernia.  Otherwise, normal barium esophagram.

## 2019-09-01 ENCOUNTER — Other Ambulatory Visit: Payer: Self-pay | Admitting: Oncology

## 2019-09-01 DIAGNOSIS — U071 COVID-19: Secondary | ICD-10-CM

## 2019-09-01 NOTE — Progress Notes (Signed)
I connected by phone with  Kevin Michael to discuss the potential use of an new treatment for mild to moderate COVID-19 viral infection in non-hospitalized patients.   This patient is a age/sex that meets the FDA criteria for Emergency Use Authorization of casirivimab\imdevimab.  Has a (+) direct SARS-CoV-2 viral test result 1. Has mild or moderate COVID-19  2. Is ? 42 years of age and weighs ? 40 kg 3. Is NOT hospitalized due to COVID-19 4. Is NOT requiring oxygen therapy or requiring an increase in baseline oxygen flow rate due to COVID-19 5. Is within 10 days of symptom onset 6. Has at least one of the high risk factor(s) for progression to severe COVID-19 and/or hospitalization as defined in EUA. ? Specific high risk criteria :obesity   Symptom onset  08/27/19.   I have spoken and communicated the following to the patient or parent/caregiver:   1. FDA has authorized the emergency use of casirivimab\imdevimab for the treatment of mild to moderate COVID-19 in adults and pediatric patients with positive results of direct SARS-CoV-2 viral testing who are 19 years of age and older weighing at least 40 kg, and who are at high risk for progressing to severe COVID-19 and/or hospitalization.   2. The significant known and potential risks and benefits of casirivimab\imdevimab, and the extent to which such potential risks and benefits are unknown.   3. Information on available alternative treatments and the risks and benefits of those alternatives, including clinical trials.   4. Patients treated with casirivimab\imdevimab should continue to self-isolate and use infection control measures (e.g., wear mask, isolate, social distance, avoid sharing personal items, clean and disinfect "high touch" surfaces, and frequent handwashing) according to CDC guidelines.    5. The patient or parent/caregiver has the option to accept or refuse casirivimab\imdevimab .   After reviewing this information with the  patient, The patient agreed to proceed with receiving casirivimab\imdevimab infusion and will be provided a copy of the Fact sheet prior to receiving the infusion.Mignon Pine, AGNP-C [phone] (Infusion Center Hotline)

## 2019-09-02 ENCOUNTER — Ambulatory Visit (HOSPITAL_COMMUNITY)
Admission: RE | Admit: 2019-09-02 | Discharge: 2019-09-02 | Disposition: A | Discharge status: Home / Self Care | Payer: 59 | Source: Ambulatory Visit | Attending: Pulmonary Disease | Admitting: Pulmonary Disease

## 2019-09-02 DIAGNOSIS — U071 COVID-19: Secondary | ICD-10-CM | POA: Diagnosis present

## 2019-09-02 MED ORDER — SODIUM CHLORIDE 0.9 % IV SOLN: INTRAVENOUS | Status: DC | PRN

## 2019-09-02 MED ORDER — FAMOTIDINE IN NACL 20-0.9 MG/50ML-% IV SOLN: 20.0000 mg | Freq: Once | INTRAVENOUS | Status: DC | PRN

## 2019-09-02 MED ORDER — METHYLPREDNISOLONE SODIUM SUCC 125 MG IJ SOLR: 125.0000 mg | Freq: Once | INTRAMUSCULAR | Status: DC | PRN

## 2019-09-02 MED ORDER — ALBUTEROL SULFATE HFA 108 (90 BASE) MCG/ACT IN AERS: 2.0000 | INHALATION_SPRAY | Freq: Once | RESPIRATORY_TRACT | Status: DC | PRN

## 2019-09-02 MED ORDER — SODIUM CHLORIDE 0.9 % IV SOLN
1200.0000 mg | Freq: Once | INTRAVENOUS | Status: AC
  Administered 2019-09-02: 1200 mg via INTRAVENOUS
  Filled 2019-09-02: qty 10

## 2019-09-02 MED ORDER — EPINEPHRINE 0.3 MG/0.3ML IJ SOAJ: 0.3000 mg | Freq: Once | INTRAMUSCULAR | Status: DC | PRN

## 2019-09-02 MED ORDER — DIPHENHYDRAMINE HCL 50 MG/ML IJ SOLN: 50.0000 mg | Freq: Once | INTRAMUSCULAR | Status: DC | PRN

## 2019-09-02 NOTE — Progress Notes (Signed)
  Diagnosis: COVID-19  Physician:Dr Wright   Procedure: [facility] Med: casirivimab\imdevimab infusion - Provided patient with casirivimab\imdevimab fact sheet for patients, parents and caregivers prior to infusion.  Complications: No immediate complications noted.  Discharge: Discharged home   Kevin Sheerin Michael 09/02/2019  

## 2019-09-02 NOTE — Discharge Instructions (Signed)

## 2022-11-08 ENCOUNTER — Emergency Department (HOSPITAL_COMMUNITY)
Admission: EM | Admit: 2022-11-08 | Discharge: 2022-11-08 | Disposition: A | Discharge status: Home / Self Care | Payer: 59 | Attending: Emergency Medicine | Admitting: Emergency Medicine

## 2022-11-08 ENCOUNTER — Emergency Department (HOSPITAL_COMMUNITY): Payer: 59

## 2022-11-08 ENCOUNTER — Other Ambulatory Visit: Payer: Self-pay

## 2022-11-08 DIAGNOSIS — R110 Nausea: Secondary | ICD-10-CM | POA: Insufficient documentation

## 2022-11-08 DIAGNOSIS — R5383 Other fatigue: Secondary | ICD-10-CM | POA: Diagnosis present

## 2022-11-08 DIAGNOSIS — R0600 Dyspnea, unspecified: Secondary | ICD-10-CM | POA: Diagnosis not present

## 2022-11-08 DIAGNOSIS — R0602 Shortness of breath: Secondary | ICD-10-CM | POA: Insufficient documentation

## 2022-11-08 LAB — COMPREHENSIVE METABOLIC PANEL
ALT: 45 U/L — ABNORMAL HIGH (ref 0–44)
AST: 23 U/L (ref 15–41)
Albumin: 3.7 g/dL (ref 3.5–5.0)
Alkaline Phosphatase: 83 U/L (ref 38–126)
Anion gap: 9 (ref 5–15)
BUN: 19 mg/dL (ref 6–20)
CO2: 23 mmol/L (ref 22–32)
Calcium: 8.7 mg/dL — ABNORMAL LOW (ref 8.9–10.3)
Chloride: 105 mmol/L (ref 98–111)
Creatinine, Ser: 0.95 mg/dL (ref 0.61–1.24)
GFR, Estimated: >60 mL/min (ref >60)
Glucose, Bld: 135 mg/dL — ABNORMAL HIGH (ref 70–99)
Potassium: 4.5 mmol/L (ref 3.5–5.1)
Sodium: 137 mmol/L (ref 135–145)
Total Bilirubin: 0.6 mg/dL (ref 0.3–1.2)
Total Protein: 6.8 g/dL (ref 6.5–8.1)

## 2022-11-08 LAB — CBC
HCT: 45.6 % (ref 39.0–52.0)
Hemoglobin: 14.8 g/dL (ref 13.0–17.0)
MCH: 28.7 pg (ref 26.0–34.0)
MCHC: 32.5 g/dL (ref 30.0–36.0)
MCV: 88.5 fL (ref 80.0–100.0)
Platelets: 317 10*3/uL (ref 150–400)
RBC: 5.15 MIL/uL (ref 4.22–5.81)
RDW: 13.7 % (ref 11.5–15.5)
WBC: 8.7 10*3/uL (ref 4.0–10.5)
nRBC: 0 % (ref 0.0–0.2)

## 2022-11-08 LAB — TROPONIN I (HIGH SENSITIVITY)
Troponin I (High Sensitivity): 7 ng/L (ref <18)
Troponin I (High Sensitivity): 5 ng/L (ref <18)

## 2022-11-08 LAB — BRAIN NATRIURETIC PEPTIDE: B Natriuretic Peptide: 211.3 pg/mL — ABNORMAL HIGH (ref 0.0–100.0)

## 2022-11-08 NOTE — Discharge Instructions (Signed)
The tests and the in the ED were reassuring.  Follow-up with your doctor as we discussed next week to be rechecked discuss further evaluation if the symptoms persist.  Return to the emergency room if you have any recurrent or worsening symptoms.

## 2022-11-08 NOTE — ED Provider Notes (Signed)
[place] EMERGENCY DEPARTMENT AT [facility] Provider Note   CSN: [id number] Arrival date & time: 11/08/22  6578     History  Chief Complaint  Patient presents with   Shortness of Breath    Kevin Michael is a 47 y.o. male.   Shortness of Breath    Patient has history of allergic rhinitis acid reflux.  Patient presents ED with complaints of shortness of breath.  Patient states the last couple days he has noticed he has been more fatigued.  He felt like his heart was beating irregularly.  At work today he noted just with any minimal activity he was becoming short of breath.  He had some nausea.  He denied any chest pain.  No leg swelling.  No history of heart disease lung disease.  No recent trips or travel  Home Medications Prior to Admission medications   Medication Sig Start Date End Date Taking? Authorizing Provider  diphenhydramine-acetaminophen (TYLENOL PM) 25-500 MG TABS tablet Take 1 tablet by mouth at bedtime.   Yes [provider]  Multiple Vitamins-Minerals (ZINC PO) Take by mouth.   Yes [provider]  omeprazole (PRILOSEC) 20 MG capsule Take 20 mg by mouth daily.   Yes [provider]  valACYclovir (VALTREX) 1000 MG tablet Take 1,000 mg by mouth daily. 10/07/15  Yes [provider]      Allergies    Iodinated contrast media, Biaxin [clarithromycin], Amoxicillin, and Penicillins    Review of Systems   Review of Systems  Respiratory:  Positive for shortness of breath.     Physical Exam Updated Vital Signs BP 122/74   Pulse 93   Temp 98.3 F (36.8 C) (Oral)   Resp (!) 27   SpO2 100%  Physical Exam Vitals and nursing note reviewed.  Constitutional:      General: He is not in acute distress.    Appearance: He is well-developed.  HENT:     Head: Normocephalic and atraumatic.     Right Ear: External ear normal.     Left Ear: External ear normal.  Eyes:     General: No scleral icterus.       Right eye: No  discharge.        Left eye: No discharge.     Conjunctiva/sclera: Conjunctivae normal.  Neck:     Trachea: No tracheal deviation.  Cardiovascular:     Rate and Rhythm: Normal rate and regular rhythm.  Pulmonary:     Effort: Pulmonary effort is normal. No respiratory distress.     Breath sounds: Normal breath sounds. No stridor. No wheezing or rales.  Abdominal:     General: Bowel sounds are normal. There is no distension.     Palpations: Abdomen is soft.     Tenderness: There is no abdominal tenderness. There is no guarding or rebound.  Musculoskeletal:        General: No tenderness or deformity.     Cervical back: Neck supple.  Skin:    General: Skin is warm and dry.     Findings: No rash.  Neurological:     General: No focal deficit present.     Mental Status: He is alert.     Cranial Nerves: No cranial nerve deficit, dysarthria or facial asymmetry.     Sensory: No sensory deficit.     Motor: No abnormal muscle tone or seizure activity.     Coordination: Coordination normal.  Psychiatric:  Mood and Affect: Mood normal.     ED Results / Procedures / Treatments   Labs (all labs ordered are listed, but only abnormal results are displayed) Labs Reviewed  COMPREHENSIVE METABOLIC PANEL - Abnormal; Notable for the following components:      Result Value   Glucose, Bld 135 (*)    Calcium 8.7 (*)    ALT 45 (*)    All other components within normal limits  BRAIN NATRIURETIC PEPTIDE - Abnormal; Notable for the following components:   B Natriuretic Peptide 211.3 (*)    All other components within normal limits  CBC  TROPONIN I (HIGH SENSITIVITY)  TROPONIN I (HIGH SENSITIVITY)    EKG EKG Interpretation Date/Time:  Friday November 08 2022 07:23:41 EDT Ventricular Rate:  94 PR Interval:  153 QRS Duration:  88 QT Interval:  357 QTC Calculation: 447 R Axis:   130  Text Interpretation: Sinus rhythm Right axis deviation Confirmed by Linwood Dibbles [phone]) on 11/08/2022  7:33:14 AM  Radiology DG Chest Port 1 View  Result Date: 11/08/2022 CLINICAL DATA:  Hypertension, shortness of breath EXAM: PORTABLE CHEST 1 VIEW COMPARISON:  None Available. FINDINGS: The heart size and mediastinal contours are within normal limits. Both lungs are clear. The visualized skeletal structures are unremarkable. IMPRESSION: No active disease. Electronically Signed   By: Judie Petit.  Shick M.D.   On: 11/08/2022 08:11    Procedures Procedures    Medications Ordered in ED Medications - No data to display  ED Course/ Medical Decision Making/ A&P Clinical Course as of 11/08/22 1110  Fri Nov 08, 2022  0857 DG Chest Staples 1 View [JK]  [phone] X-ray without acute findings [JK]  0857 CBC CBC normal [JK]  0955 Comprehensive metabolic panel(!) No significant abnormalities [JK]  0955 CBC Normal, troponin normal [JK]  0955 Brain natriuretic peptide(!) Slightly elevated [JK]  1040 Second troponin normal [JK]  1110 Telemetry strips reviewed during his ED stay.  No dysrhythmia noted [JK]    Clinical Course User Index [JK] Linwood Dibbles, MD                             Louie Casa (Revised) Score: 3, Geneva Score Interpretation: Low Risk Group: 7-9% incidence of pulmonary embolism from several studies PERC Score: 0, PERC Score Interpretation: No need for further workup, as <2% chance of PE.  If no criteria are positive and clinicians pre-test probability is <15%, PERC Rule criteria are satisfied Medical Decision Making Problems Addressed: Dyspnea, unspecified type: acute illness or injury that poses a threat to life or bodily functions Fatigue, unspecified type: acute illness or injury that poses a threat to life or bodily functions  Amount and/or Complexity of Data Reviewed Labs: ordered. Decision-making details documented in ED Course. Radiology: ordered and independent interpretation performed. Decision-making details documented in ED Course.   Patient presented to the ED with complaints of  feeling fatigued and dyspnea on exertion recently.  ED workup overall reassuring.  Patient is not anemic.  No signs of dehydration.  Patient not having any chest pain.  Serial troponins normal.  Doubt acute coronary event.  Patient without any risk factors for PE.  Low risk criteria.  PERC score negative.  Doubt pulmonary embolism.  BNP slightly elevated but presentation not suggestive of acute CHF exacerbation.  Chest x-ray negative.  Possible patient may have had some type of dysrhythmia.  Will have him follow-up with a primary care doctor next week.  If symptoms persist could consider further cardiac workup such as echocardiogram and cardiac monitoring.  Warning signs precautions discussed.  Evaluation and diagnostic testing in the emergency department does not suggest an emergent condition requiring admission or immediate intervention beyond what has been performed at this time.  The patient is safe for discharge and has been instructed to return immediately for worsening symptoms, change in symptoms or any other concerns.        Final Clinical Impression(s) / ED Diagnoses Final diagnoses:  Fatigue, unspecified type  Dyspnea, unspecified type    Rx / DC Orders ED Discharge Orders     None         Linwood Dibbles, MD 11/08/22 1112

## 2022-11-08 NOTE — ED Triage Notes (Signed)
Patient BIB EMS from work c/o SOB and nausea. Pale, clammy, and SOB. Lungs sounds clear. NSR. 128/78, 92, 98%ra

## 2022-11-12 ENCOUNTER — Encounter: Payer: Self-pay | Admitting: Cardiology

## 2022-11-12 ENCOUNTER — Ambulatory Visit (INDEPENDENT_AMBULATORY_CARE_PROVIDER_SITE_OTHER): Payer: 59

## 2022-11-12 ENCOUNTER — Ambulatory Visit: Payer: 59 | Attending: Cardiology | Admitting: Cardiology

## 2022-11-12 VITALS — BP 110/72 | HR 84 | Resp 14 | Ht 71.0 in | Wt 329.6 lb

## 2022-11-12 DIAGNOSIS — R0602 Shortness of breath: Secondary | ICD-10-CM | POA: Diagnosis not present

## 2022-11-12 DIAGNOSIS — R072 Precordial pain: Secondary | ICD-10-CM

## 2022-11-12 DIAGNOSIS — G4730 Sleep apnea, unspecified: Secondary | ICD-10-CM

## 2022-11-12 DIAGNOSIS — Z6841 Body Mass Index (BMI) 40.0 and over, adult: Secondary | ICD-10-CM

## 2022-11-12 DIAGNOSIS — Z9884 Bariatric surgery status: Secondary | ICD-10-CM

## 2022-11-12 DIAGNOSIS — R002 Palpitations: Secondary | ICD-10-CM

## 2022-11-12 DIAGNOSIS — E66813 Obesity, class 3: Secondary | ICD-10-CM

## 2022-11-12 NOTE — Progress Notes (Signed)
Cardiology Office Note:    Date:  11/12/2022  NAME:  Kevin Michael    MRN: [id number] DOB:  12-22-1975   PCP:  Irena Reichmann, DO  Former Cardiology Providers: None Primary Cardiologist:  Tessa Lerner, DO, [facility] (established care 11/12/2022) Electrophysiologist:  None   Referring MD: Irena Reichmann, DO  Reason of Consult: Establish care, recent ER visit  Chief Complaint  Patient presents with   Establish Care   Shortness of Breath   Palpitations   Chest Pain    History of Present Illness:    Kevin Michael is a 47 y.o. Caucasian male whose past medical history and cardiovascular risk factors includes: sleep apnea on CPAP, former smoker, hx of diabetes (off meds after gastric bypass), hx of gastric bypass.  Self-referred to the practice to establish care and to evaluate shortness of breath and chest pain and palpitations.    Patient is self-referred to the practice for evaluation of shortness of breath and establish care.  Last week while at work patient noted shortness of breath predominantly with effort related activities when he went from his car to the office and the shortness of breath is also associated with palpitations.  He had EMS on campus at his work who brought him to [facility], ED for further evaluation and management.  EKG in the emergency room department did not illustrate atrial fibrillation, labs reviewed, elevation of BNP levels.  He now presents to the office for follow-up and to establish care.  Shortness of breath: More noticeable with effort related activities. Likely improved by 50% since his ER visit last week. Denies orthopnea, PND, lower extremity swelling.   No prior history of congestive heart failure or coronary artery disease.  Precordial pain: Started last week Friday, occurs over the anterior precordium. Describes it as a sharp feeling. Lasting for a few seconds. Not brought on by effort related activities and does not resolve with  rest. Self-limited.  Irregular pulse/palpitations: Onset last week. Can occur for hours in duration. No improving or worsening factors. Could be associated with stress both at work and personal life. No near-syncope or syncopal events  Factors to consider: History of thyroid disease: no History of anemia: none Coffee consumption: 1 cup / day  Caffeinated beverages/sodas: 1 soda / day  Energy drinks: none Alcohol use: none Stimulants: none Illicit drugs: none Weight loss supplements: none New over-the-counter medications: none Herbal supplements: none  Current Medications: Current Meds  Medication Sig   diphenhydramine-acetaminophen (TYLENOL PM) 25-500 MG TABS tablet Take 1 tablet by mouth at bedtime.   Multiple Vitamins-Minerals (ZINC PO) Take by mouth.   omeprazole (PRILOSEC) 20 MG capsule Take 20 mg by mouth daily.   valACYclovir (VALTREX) 1000 MG tablet Take 1,000 mg by mouth daily.     Allergies:    Iodinated contrast media, Biaxin [clarithromycin], Amoxicillin, and Penicillins   Past Medical History: Past Medical History:  Diagnosis Date   Allergic rhinitis    Dyspnea    Fatigue    GERD (gastroesophageal reflux disease)    Sleep apnea    CPAP    Past Surgical History: Past Surgical History:  Procedure Laterality Date   BREAST SURGERY Right 1997   removal of fatty tissue from right breast.   TONSILLECTOMY AND ADENOIDECTOMY Bilateral 01/18/2016   Procedure: TONSILLECTOMY;  Surgeon: Bud Face, MD;  Location: [facility] ORS;  Service: ENT;  Laterality: Bilateral;    Social History: Social History   Tobacco Use   Smoking status:  Former    Current packs/day: 0.00    Average packs/day: 0.3 packs/day for 15.0 years (3.8 ttl pk-yrs)    Types: Cigarettes    Start date: 10/24/2000    Quit date: 10/25/2015    Years since quitting: 7.0   Smokeless tobacco: Never  Substance Use Topics   Alcohol use: Yes    Alcohol/week: 2.0 standard drinks of alcohol     Types: 2 Cans of beer per week    Comment: OCC   Drug use: No    Family History: Family History  Problem Relation Age of Onset   Hypertension Mother    Bladder Cancer Father    Cancer Father        bladder   Cancer Paternal Grandfather        bladder/prostate    ROS:   Review of Systems  Cardiovascular:  Positive for palpitations. Negative for chest pain, claudication, irregular heartbeat, leg swelling, near-syncope, orthopnea, paroxysmal nocturnal dyspnea and syncope.  Respiratory:  Positive for shortness of breath.   Hematologic/Lymphatic: Negative for bleeding problem.  Psychiatric/Behavioral:  The patient is nervous/anxious.     EKGs/Labs/Other Studies Reviewed:   EKG Interpretation Date/Time:  Tuesday November 12 2022 13:35:13 EST Ventricular Rate:  79 PR Interval:  150 QRS Duration:  92 QT Interval:  380 QTC Calculation: 435 R Axis:   -67  Text Interpretation: Normal sinus rhythm Left anterior fascicular block When compared with ECG of 08-Nov-2022 07:23, No significant change since last tracing Confirmed by Tessa Lerner [phone]) on 11/12/2022 1:40:56 PM    Labs:    Latest Ref Rng & Units 11/08/2022    7:57 AM 11/22/2015    7:50 AM 01/23/2014    2:29 PM  CBC  WBC 4.0 - 10.5 K/uL 8.7  5.4  12.3   Hemoglobin 13.0 - 17.0 g/dL 60.4  54.0  98.1   Hematocrit 39.0 - 52.0 % 45.6  40.2  42.7   Platelets 150 - 400 K/uL 317  283  316        Latest Ref Rng & Units 11/08/2022    7:57 AM 11/22/2015    7:50 AM 01/23/2014    2:29 PM  BMP  Glucose 70 - 99 mg/dL 191  478  94   BUN 6 - 20 mg/dL 19  11  21    Creatinine 0.61 - 1.24 mg/dL 2.95  6.21  3.08   Sodium 135 - 145 mmol/L 137  138  141   Potassium 3.5 - 5.1 mmol/L 4.5  4.3  3.7   Chloride 98 - 111 mmol/L 105  107  107   CO2 22 - 32 mmol/L 23  28  24    Calcium 8.9 - 10.3 mg/dL 8.7  8.9  8.6       Latest Ref Rng & Units 11/08/2022    7:57 AM 11/22/2015    7:50 AM 01/23/2014    2:29 PM  CMP  Glucose 70 - 99 mg/dL  657  846  94   BUN 6 - 20 mg/dL 19  11  21    Creatinine 0.61 - 1.24 mg/dL 9.62  9.52  8.41   Sodium 135 - 145 mmol/L 137  138  141   Potassium 3.5 - 5.1 mmol/L 4.5  4.3  3.7   Chloride 98 - 111 mmol/L 105  107  107   CO2 22 - 32 mmol/L 23  28  24    Calcium 8.9 - 10.3 mg/dL 8.7  8.9  8.6  Total Protein 6.5 - 8.1 g/dL 6.8  6.5  7.8   Total Bilirubin 0.3 - 1.2 mg/dL 0.6  0.7  0.3   Alkaline Phos 38 - 126 U/L 83  67  84   AST 15 - 41 U/L 23  15  22    ALT 0 - 44 U/L 45  17  35     Lab Results  Component Value Date   CHOL 169 11/22/2015   HDL 28 (L) 11/22/2015   LDLCALC 115 (H) 11/22/2015   TRIG 131 11/22/2015   CHOLHDL 6.0 (H) 11/22/2015   No results for input(s): "LIPOA" in the last 8760 hours. No components found for: "NTPROBNP" No results for input(s): "PROBNP" in the last 8760 hours. No results for input(s): "TSH" in the last 8760 hours.  Physical Exam:    Today's Vitals   11/12/22 1331  BP: 110/72  Pulse: 84  Resp: 14  SpO2: 98%  Weight: (!) 329 lb 9.6 oz (149.5 kg)  Height: 5\' 11"  (1.803 m)   Body mass index is 45.97 kg/m. Wt Readings from Last 3 Encounters:  11/12/22 (!) 329 lb 9.6 oz (149.5 kg)  10/02/17 (!) 434 lb 4.9 oz (197 kg)  01/18/16 (!) 330 lb (149.7 kg)    Physical Exam  Constitutional: No distress.  hemodynamically stable  Neck: No JVD present.  Cardiovascular: Normal rate, regular rhythm, S1 normal and S2 normal. Exam reveals no gallop, no S3 and no S4.  No murmur heard. Pulmonary/Chest: Effort normal and breath sounds normal. No stridor. He has no wheezes. He has no rales.  Abdominal: Soft. Bowel sounds are normal. He exhibits no distension. There is no abdominal tenderness.  Musculoskeletal:        General: No edema.     Cervical back: Neck supple.  Neurological: He is alert and oriented to person, place, and time. He has intact cranial nerves (2-12).  Skin: Skin is warm.     Impression & Recommendation(s):  Impression:   ICD-10-CM    1. Shortness of breath  R06.02 EKG 12-Lead    B Nat Peptide    B Nat Peptide    2. Precordial pain  R07.2 CT CARDIAC SCORING (SELF PAY ONLY)    ECHOCARDIOGRAM COMPLETE    ECHOCARDIOGRAM STRESS TEST    Cardiac Stress Test: Informed Consent Details: Physician/Practitioner Attestation; Transcribe to consent form and obtain patient signature    3. Palpitations  R00.2 TSH    LONG TERM MONITOR (3-14 DAYS)    TSH    4. Sleep apnea in adult  G47.30     5. Hx of gastric bypass  Z98.84     6. Class 3 severe obesity due to excess calories without serious comorbidity with body mass index (BMI) of 45.0 to 49.9 in adult [facility])  Z61.096    Z68.42    E66.01        Recommendation(s):  Shortness of breath Elevated BNP levels Patient went to the ED on November 08, 2022 for evaluation of shortness of breath.   ER documentation reviewed.   Labs noted elevated BNP level likely secondary to elevated blood pressures/palpitations.  Patient states that when EMS checked his blood pressures his SBP> 170 mmHg. Will recheck BNP level. Echo will be ordered to evaluate for structural heart disease and left ventricular systolic function.  Precordial pain Precordial discomfort predominantly noncardiac. ED visit 11/08/2022-high sensitive troponins negative x 2. Patient is allergic to contrast and therefore we will hold off on coronary CTA at  this time. Stress Echocardiogram to evaluate for exercise-induced arrhythmias/wall motion of normalities. Coronary calcium score to evaluate for CAC/further risk stratification EKG today is nonischemic  Palpitations No identifiable reversible cause. Hemoglobin within acceptable limits. Check TSH. 14-day cardiac monitor to evaluate for dysrhythmias.  Sleep apnea in adult Patient is compliant with his CPAP on a regular basis.  Hx of gastric bypass Class 3 severe obesity due to excess calories without serious comorbidity with body mass index (BMI) of 45.0 to 49.9 in  adult [facility]) Encouraged him to talk to his PCP with regards to screening him for diabetes as well as lipids. Body mass index is 45.97 kg/m. I reviewed with him importance of diet, regular physical activity/exercise, weight loss.   Patient is educated on the importance of increasing physical activity gradually as tolerated with a goal of moderate intensity exercise for 30 minutes a day 5 days a week.  Orders Placed:  Orders Placed This Encounter  Procedures   CT CARDIAC SCORING (SELF PAY ONLY)    Standing Status:   Future    Standing Expiration Date:   11/12/2023    Order Specific Question:   Preferred imaging location?    Answer:   [facility]   TSH    Standing Status:   Future    Number of Occurrences:   1    Standing Expiration Date:   11/12/2023   B Nat Peptide    Standing Status:   Future    Number of Occurrences:   1    Standing Expiration Date:   11/12/2023   Cardiac Stress Test: Informed Consent Details: Physician/Practitioner Attestation; Transcribe to consent form and obtain patient signature    Order Specific Question:   Physician/Practitioner attestation of informed consent for procedure/surgical case    Answer:   I, the physician/practitioner, attest that I have discussed with the patient the benefits, risks, side effects, alternatives, likelihood of achieving goals and potential problems during recovery for the procedure that I have provided informed consent.    Order Specific Question:   Procedure    Answer:   Stress Echocardiogram    Order Specific Question:   Indication/Reason    Answer:   Chest pain   LONG TERM MONITOR (3-14 DAYS)    Standing Status:   Future    Number of Occurrences:   1    Standing Expiration Date:   11/12/2023    Order Specific Question:   Where should this test be performed?    Answer:   [facility]    Order Specific Question:   Does the patient have an implanted cardiac device?    Answer:   No    Order Specific Question:   Prescribed days of  wear    Answer:   27    Order Specific Question:   Type of enrollment    Answer:   Clinic Enrollment   EKG 12-Lead   ECHOCARDIOGRAM COMPLETE    Standing Status:   Future    Standing Expiration Date:   11/12/2023    Order Specific Question:   Where should this test be performed    Answer:   [facility])    Order Specific Question:   Does the patient weigh less than or greater than 250 lbs?    Answer:   Patient weighs greater than 250 lbs    Order Specific Question:   Perflutren DEFINITY (image enhancing agent) should be administered unless hypersensitivity or allergy exist  Answer:   Administer Perflutren    Order Specific Question:   Reason for exam-Echo    Answer:   Chest Pain  R07.9   ECHOCARDIOGRAM STRESS TEST    Standing Status:   Future    Standing Expiration Date:   11/12/2023    Order Specific Question:   Where should this be performed?    Answer:   [facility])    Order Specific Question:   Perflutren DEFINITY (image enhancing agent) should be administered unless hypersensitivity or allergy exist    Answer:   Administer Perflutren    Order Specific Question:   Stress with pharmacologic or treadmill ?    Answer:   Treadmill w/ exercise    Order Specific Question:   Is patient able to ambulate on a treadmill?    Answer:   Yes    As part of medical decision making results of the ER documentation, labs from 11/08/2022, EKG were reviewed independently at today's visit.   Final Medication List:   No orders of the defined types were placed in this encounter.   There are no discontinued medications.   Current Outpatient Medications:    diphenhydramine-acetaminophen (TYLENOL PM) 25-500 MG TABS tablet, Take 1 tablet by mouth at bedtime., Disp: , Rfl:    Multiple Vitamins-Minerals (ZINC PO), Take by mouth., Disp: , Rfl:    omeprazole (PRILOSEC) 20 MG capsule, Take 20 mg by mouth daily., Disp: , Rfl:    valACYclovir (VALTREX) 1000 MG  tablet, Take 1,000 mg by mouth daily., Disp: , Rfl: 11  Consent:   Informed Consent   Shared Decision Making/Informed Consent The risks [chest pain, shortness of breath, cardiac arrhythmias, dizziness, blood pressure fluctuations, myocardial infarction, stroke/transient ischemic attack, and life-threatening complications (estimated to be 1 in 10,000)], benefits (risk stratification, diagnosing coronary artery disease, treatment guidance) and alternatives of a stress echocardiogram were discussed in detail with Mr. Bosher and he agrees to proceed.     Disposition:   7 weeks with myself to reevaluate symptoms of shortness of breath and palpitations and discuss test results.  Patient may be asked to follow-up sooner based on the results of the above-mentioned testing.  His questions and concerns were addressed to his satisfaction. He voices understanding of the recommendations provided during this encounter.    Signed, Tessa Lerner, DO, [facility]  [facility]  [address] [place], [place] [address] 11/12/2022 5:26 PM

## 2022-11-12 NOTE — Patient Instructions (Addendum)
Medication Instructions:  Your physician recommends that you continue on your current medications as directed. Please refer to the Current Medication list given to you today.  *If you need a refill on your cardiac medications before your next appointment, please call your pharmacy*  Lab Work: BNP and TSH to be completed TODAY  If you have labs (blood work) drawn today and your tests are completely normal, you will receive your results only by: MyChart Message (if you have MyChart) OR A paper copy in the mail If you have any lab test that is abnormal or we need to change your treatment, we will call you to review the results.  Testing/Procedures: Your physician has requested that you have an echocardiogram. Echocardiography is a painless test that uses sound waves to create images of your heart. It provides your doctor with information about the size and shape of your heart and how well your heart's chambers and valves are working. This procedure takes approximately one hour. There are no restrictions for this procedure. Please do NOT wear cologne, perfume, aftershave, or lotions (deodorant is allowed). Please arrive 15 minutes prior to your appointment time.  Please note: We ask at that you not bring children with you during ultrasound (echo/ vascular) testing. Due to room size and safety concerns, children are not allowed in the ultrasound rooms during exams. Our front office staff cannot provide observation of children in our lobby area while testing is being conducted. An adult accompanying a patient to their appointment will only be allowed in the ultrasound room at the discretion of the ultrasound technician under special circumstances. We apologize for any inconvenience.  Your physician has requested that you have a stress echocardiogram. For further information please visit [email or web address] Please follow instruction sheet as given.  Please note: We ask at that you not bring children  with you during ultrasound (echo/ vascular) testing. Due to room size and safety concerns, children are not allowed in the ultrasound rooms during exams. Our front office staff cannot provide observation of children in our lobby area while testing is being conducted. An adult accompanying a patient to their appointment will only be allowed in the ultrasound room at the discretion of the ultrasound technician under special circumstances. We apologize for any inconvenience.  Your physician has requested that you wear a Zio heart monitor for 14 days. This will be mailed to your home with instructions on how to apply the monitor and how to return it when finished. Please allow 2 weeks after returning the heart monitor before our office calls you with the results.  Your physician has requested that you have a coronary calcium score performed. This is not covered by insurance and will be an out-of-pocket cost of approximately $99.   Follow-Up: At [facility], you and your health needs are our priority.  As part of our continuing mission to provide you with exceptional heart care, we have created designated Provider Care Teams.  These Care Teams include your primary Cardiologist (physician) and Advanced Practice Providers (APPs -  Physician Assistants and Nurse Practitioners) who all work together to provide you with the care you need, when you need it.  Your next appointment:   7 week(s)  The format for your next appointment:   In Person  Provider:   Tessa Lerner, DO {  Other Instructions ZIO XT- Long Term Monitor Instructions     Your physician has requested you wear a ZIO patch monitor for 14 days.  This is a  single patch monitor. [employer] supplies one patch monitor per enrollment. Additional  stickers are not available. Please do not apply patch if you will be having a Nuclear Stress Test,  Echocardiogram, Cardiac CT, MRI, or Chest Xray during the period you would be wearing the  monitor. The  patch cannot be worn during these tests. You cannot remove and re-apply the  ZIO XT patch monitor.  Once you have received your monitor, please review the enclosed instructions. Your monitor  has already been registered assigning a specific monitor serial # to you.     Billing and Patient Assistance Program Information     We have supplied [employer] with any of your insurance information on file for billing purposes.  [employer] offers a sliding scale Patient Assistance Program for patients that do not have  insurance, or whose insurance does not completely cover the cost of the ZIO monitor.  You must apply for the Patient Assistance Program to qualify for this discounted rate.  To apply, please call [employer] at [phone], select option 4, select option 2, ask to apply for  Patient Assistance Program. Meredeth Ide will ask your household income, and how many people  are in your household. They will quote your out-of-pocket cost based on that information.  [employer] will also be able to set up a 60-month, interest-free payment plan if needed.     Applying the monitor     Shave hair from upper left chest.  Hold abrader disc by orange tab. Rub abrader in 40 strokes over the upper left chest as  indicated in your monitor instructions.  Clean area with 4 enclosed alcohol pads. Let dry.  Apply patch as indicated in monitor instructions. Patch will be placed under collarbone on left  side of chest with arrow pointing upward.  Rub patch adhesive wings for 2 minutes. Remove white label marked "1". Remove the white  label marked "2". Rub patch adhesive wings for 2 additional minutes.  While looking in a mirror, press and release button in center of patch. A small green light will  flash 3-4 times. This will be your only indicator that the monitor has been turned on.  Do not shower for the first 24 hours. You may shower after the first 24 hours.  Press the button if you feel a symptom. You will hear a small  click. Record Date, Time and  Symptom in the Patient Logbook.  When you are ready to remove the patch, follow instructions on the last 2 pages of Patient  Logbook. Stick patch monitor onto the last page of Patient Logbook.  Place Patient Logbook in the blue and white box. Use locking tab on box and tape box closed  securely. The blue and white box has prepaid postage on it. Please place it in the mailbox as  soon as possible. Your physician should have your test results approximately 7 days after the  monitor has been mailed back to [facility].  Call [facility] Customer Care at [phone] if you have questions regarding  your ZIO XT patch monitor. Call them immediately if you see an orange light blinking on your  monitor.  If your monitor falls off in less than 4 days, contact our Monitor department at [phone].  If your monitor becomes loose or falls off after 4 days call [employer] at [phone] for  suggestions on securing your monitor.

## 2022-11-13 LAB — TSH: TSH: 2.11 u[IU]/mL (ref 0.450–4.500)

## 2022-11-13 LAB — BRAIN NATRIURETIC PEPTIDE: BNP: 15.1 pg/mL (ref 0.0–100.0)

## 2022-11-25 ENCOUNTER — Ambulatory Visit (HOSPITAL_COMMUNITY)
Admission: RE | Admit: 2022-11-25 | Discharge: 2022-11-25 | Disposition: A | Discharge status: Home / Self Care | Payer: 59 | Source: Ambulatory Visit | Attending: Cardiology | Admitting: Cardiology

## 2022-11-25 DIAGNOSIS — R072 Precordial pain: Secondary | ICD-10-CM | POA: Insufficient documentation

## 2022-11-29 DIAGNOSIS — R002 Palpitations: Secondary | ICD-10-CM

## 2022-12-16 ENCOUNTER — Encounter (HOSPITAL_COMMUNITY): Payer: Self-pay

## 2022-12-19 ENCOUNTER — Ambulatory Visit (HOSPITAL_COMMUNITY): Payer: 59

## 2022-12-19 ENCOUNTER — Ambulatory Visit (HOSPITAL_BASED_OUTPATIENT_CLINIC_OR_DEPARTMENT_OTHER): Payer: 59

## 2022-12-19 ENCOUNTER — Ambulatory Visit (HOSPITAL_COMMUNITY): Payer: 59 | Attending: Cardiology

## 2022-12-19 ENCOUNTER — Encounter (HOSPITAL_COMMUNITY): Payer: 59

## 2022-12-19 DIAGNOSIS — R072 Precordial pain: Secondary | ICD-10-CM | POA: Diagnosis present

## 2022-12-19 LAB — ECHOCARDIOGRAM COMPLETE
Area-P 1/2: 4.36 cm2
S' Lateral: 3.1 cm

## 2022-12-19 MED ORDER — PERFLUTREN LIPID MICROSPHERE
6.0000 mL | INTRAVENOUS | Status: AC | PRN
  Administered 2022-12-19: 6 mL via INTRAVENOUS

## 2023-01-06 ENCOUNTER — Encounter: Payer: Self-pay | Admitting: Cardiology

## 2023-01-06 ENCOUNTER — Ambulatory Visit: Payer: 59 | Attending: Cardiology | Admitting: Cardiology

## 2023-01-06 VITALS — BP 122/76 | HR 104 | Resp 16 | Ht 71.0 in | Wt 336.0 lb

## 2023-01-06 DIAGNOSIS — G4730 Sleep apnea, unspecified: Secondary | ICD-10-CM | POA: Diagnosis not present

## 2023-01-06 DIAGNOSIS — R072 Precordial pain: Secondary | ICD-10-CM | POA: Diagnosis not present

## 2023-01-06 DIAGNOSIS — I2584 Coronary atherosclerosis due to calcified coronary lesion: Secondary | ICD-10-CM

## 2023-01-06 DIAGNOSIS — I2510 Atherosclerotic heart disease of native coronary artery without angina pectoris: Secondary | ICD-10-CM | POA: Diagnosis not present

## 2023-01-06 DIAGNOSIS — R0602 Shortness of breath: Secondary | ICD-10-CM

## 2023-01-06 DIAGNOSIS — Z6841 Body Mass Index (BMI) 40.0 and over, adult: Secondary | ICD-10-CM

## 2023-01-06 DIAGNOSIS — R002 Palpitations: Secondary | ICD-10-CM

## 2023-01-06 DIAGNOSIS — E66813 Obesity, class 3: Secondary | ICD-10-CM

## 2023-01-06 MED ORDER — EZETIMIBE 10 MG PO TABS: 10.0000 mg | ORAL_TABLET | Freq: Every day | ORAL | 3 refills | Status: AC

## 2023-01-06 NOTE — Patient Instructions (Signed)
Medication Instructions:  Your physician has recommended you make the following change in your medication:   START Zetia 10 mg daily   *If you need a refill on your cardiac medications before your next appointment, please call your pharmacy*  Lab Work: To Be Completed in 6 weeks: FASTING Lipid panel, direct LDL and a CMP If you have labs (blood work) drawn today and your tests are completely normal, you will receive your results only by: MyChart Message (if you have MyChart) OR A paper copy in the mail If you have any lab test that is abnormal or we need to change your treatment, we will call you to review the results.  Testing/Procedures: None ordered today.  Follow-Up: At [facility], you and your health needs are our priority.  As part of our continuing mission to provide you with exceptional heart care, we have created designated Provider Care Teams.  These Care Teams include your primary Cardiologist (physician) and Advanced Practice Providers (APPs -  Physician Assistants and Nurse Practitioners) who all work together to provide you with the care you need, when you need it.  We recommend signing up for the patient portal called "MyChart".  Sign up information is provided on this After Visit Summary.  MyChart is used to connect with patients for Virtual Visits (Telemedicine).  Patients are able to view lab/test results, encounter notes, upcoming appointments, etc.  Non-urgent messages can be sent to your provider as well.   To learn more about what you can do with MyChart, go to [email or web address].    Your next appointment:   1 year(s)  The format for your next appointment:   In Person  Provider:   Tessa Lerner, DO {

## 2023-01-06 NOTE — Progress Notes (Signed)
Cardiology Office Note:    Date:  01/06/2023  NAME:  Kevin Michael    MRN: [id number] DOB:  1975-10-15   PCP:  Irena Reichmann, DO  Former Cardiology Providers: None Primary Cardiologist:  Tessa Lerner, DO, [facility] (established care 11/12/2022) Electrophysiologist:  None   Chief Complaint  Patient presents with   Results   Follow-up    History of Present Illness:    Kevin Michael is a 47 y.o. Caucasian male whose past medical history and cardiovascular risk factors includes: Moderate coronary calcification, sleep apnea on CPAP, former smoker, hx of diabetes (off meds after gastric bypass), hx of gastric bypass.  Self-referred to the practice to establish care and to evaluate shortness of breath and chest pain and palpitations.    Patient is self-referred to the practice for evaluation of shortness of breath and establish care.  Since last office visit he no longer has experienced any recurrence of chest pain, shortness of breath, or palpitations.  Patient states that his PCP started him on BuSpar and all of his symptoms have improved markedly.  The cardiovascular workup but from his last office visit notes moderate coronary calcification, preserved LVEF, stress echocardiogram is low risk.  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   Current Medications: Current Meds  Medication Sig   busPIRone (BUSPAR) 5 MG tablet Take 5-10 mg by mouth 2 (two) times daily as needed.   diphenhydramine-acetaminophen (TYLENOL PM) 25-500 MG TABS tablet Take 1 tablet by mouth at bedtime.   ezetimibe (ZETIA) 10 MG tablet Take 1 tablet (10 mg total) by mouth daily.   Multiple Vitamins-Minerals (ZINC PO) Take by mouth.   omeprazole (PRILOSEC) 20 MG capsule Take 20 mg by mouth daily.   valACYclovir (VALTREX) 1000 MG tablet Take 1,000 mg by mouth daily.     Allergies:    Iodinated  contrast media, Biaxin [clarithromycin], Amoxicillin, and Penicillins   Past Medical History: Past Medical History:  Diagnosis Date   Allergic rhinitis    Dyspnea    Fatigue    GERD (gastroesophageal reflux disease)    Sleep apnea    CPAP    Past Surgical History: Past Surgical History:  Procedure Laterality Date   BREAST SURGERY Right 1997   removal of fatty tissue from right breast.   TONSILLECTOMY AND ADENOIDECTOMY Bilateral 01/18/2016   Procedure: TONSILLECTOMY;  Surgeon: Bud Face, MD;  Location: [facility] ORS;  Service: ENT;  Laterality: Bilateral;    Social History: Social History   Tobacco Use   Smoking status: Former    Current packs/day: 0.00    Average packs/day: 0.3 packs/day for 15.0 years (3.8 ttl pk-yrs)    Types: Cigarettes    Start date: 10/24/2000    Quit date: 10/25/2015    Years since quitting: 7.2   Smokeless tobacco: Never  Substance Use Topics   Alcohol use: Yes    Alcohol/week: 2.0 standard drinks of alcohol    Types: 2 Cans of beer per week    Comment: OCC   Drug use: No    Family History: Family History  Problem Relation Age of Onset   Hypertension Mother    Bladder Cancer Father    Cancer Father        bladder   Cancer Paternal Grandfather        bladder/prostate    ROS:   Review of Systems  Cardiovascular:  Negative  for chest pain, claudication, irregular heartbeat, leg swelling, near-syncope, orthopnea, palpitations, paroxysmal nocturnal dyspnea and syncope.  Respiratory:  Negative for shortness of breath.   Hematologic/Lymphatic: Negative for bleeding problem.    EKGs/Labs/Other Studies Reviewed:    Echocardiogram 12/19/2022: LVEF 65 to 70%. Normal diastolic function. Mild PASP at 37.8 mmHg No significant valvular heart disease. Estimated RAP 3 mmHg   Stress echo 12/19/2022 Exercise time 6 minutes 30 seconds. Achieved 7.4 METS. Hypertensive response to exercise Stress ECG negative for ischemia. Stress echo peak  hyperdynamic without any regional wall motion abnormalities   Cardiac monitor (Zio Patch): 11/29/2022 - 12/04/2022 Dominant rhythm sinus. Heart rate 46-161 bpm.  Avg HR 75 bpm. No atrial fibrillation detected during the monitoring period. No ventricular tachycardia, high grade AV block, pauses (3 seconds or longer). 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 Total ventricular ectopic burden <1%. Patient triggered events: 0.    Coronary artery calcium score: November 2024 Coronary calcium score of 174. This was 73 th percentile for age and sex matched control. Radiology Overread : No acute or unexpected extracardiac findings.  Labs:    Latest Ref Rng & Units 11/08/2022    7:57 AM 11/22/2015    7:50 AM 01/23/2014    2:29 PM  CBC  WBC 4.0 - 10.5 K/uL 8.7  5.4  12.3   Hemoglobin 13.0 - 17.0 g/dL 29.5  28.4  13.2   Hematocrit 39.0 - 52.0 % 45.6  40.2  42.7   Platelets 150 - 400 K/uL 317  283  316       Latest Ref Rng & Units 11/08/2022    7:57 AM 11/22/2015    7:50 AM 01/23/2014    2:29 PM  CMP  Glucose 70 - 99 mg/dL 440  102  94   BUN 6 - 20 mg/dL 19  11  21    Creatinine 0.61 - 1.24 mg/dL 7.25  3.66  4.40   Sodium 135 - 145 mmol/L 137  138  141   Potassium 3.5 - 5.1 mmol/L 4.5  4.3  3.7   Chloride 98 - 111 mmol/L 105  107  107   CO2 22 - 32 mmol/L 23  28  24    Calcium 8.9 - 10.3 mg/dL 8.7  8.9  8.6   Total Protein 6.5 - 8.1 g/dL 6.8  6.5  7.8   Total Bilirubin 0.3 - 1.2 mg/dL 0.6  0.7  0.3   Alkaline Phos 38 - 126 U/L 83  67  84   AST 15 - 41 U/L 23  15  22    ALT 0 - 44 U/L 45  17  35     Lab Results  Component Value Date   CHOL 169 11/22/2015   HDL 28 (L) 11/22/2015   LDLCALC 115 (H) 11/22/2015   TRIG 131 11/22/2015   CHOLHDL 6.0 (H) 11/22/2015   Recent Labs    11/12/22 1450  TSH 2.110   External Labs: Collected: January 10, 2022 available in Care Everywhere. BUN 17, creatinine 0.93. Potassium  4.5. AST 29. ALT 57, alkaline phosphatase 128 (above normal limits) A1c 5.8. Total cholesterol 184, triglycerides 145, HDL 37, LDL calculated 121  External Labs: Collected: November 2024 provided by referring physician Total cholesterol 166, triglycerides 117, HDL 34, LDL calculated 111    Physical Exam:    Today's Vitals   01/06/23 1355  BP: 122/76  Pulse: (!) 104  Resp: 16  SpO2: 96%  Weight: (!) 336 lb (152.4 kg)  Height: 5\' 11"  (1.803 m)   Body mass index is 46.86 kg/m. Wt Readings from Last 3 Encounters:  01/06/23 (!) 336 lb (152.4 kg)  11/12/22 (!) 329 lb 9.6 oz (149.5 kg)  10/02/17 (!) 434 lb 4.9 oz (197 kg)    Physical Exam  Constitutional: No distress.  hemodynamically stable  Neck: No JVD present.  Cardiovascular: Normal rate, regular rhythm, S1 normal and S2 normal. Exam reveals no gallop, no S3 and no S4.  No murmur heard. Pulmonary/Chest: Effort normal and breath sounds normal. No stridor. He has no wheezes. He has no rales.  Abdominal: Soft. Bowel sounds are normal. He exhibits no distension. There is no abdominal tenderness.  Musculoskeletal:        General: No edema.     Cervical back: Neck supple.  Neurological: He is alert and oriented to person, place, and time. He has intact cranial nerves (2-12).  Skin: Skin is warm.   Impression & Recommendation(s):  Impression:   ICD-10-CM   1. Calcification of native coronary artery  I25.10 ezetimibe (ZETIA) 10 MG tablet   I25.84 Comprehensive metabolic panel    LDL cholesterol, direct    Lipid panel    Lipid panel    LDL cholesterol, direct    Comprehensive metabolic panel    2. Precordial pain  R07.2     3. Shortness of breath  R06.02     4. Sleep apnea in adult  G47.30     5. Palpitations  R00.2     6. Class 3 severe obesity due to excess calories without serious comorbidity with body mass index (BMI) of 45.0 to 49.9 in adult [facility])  Y78.295    Z68.42    E66.01      Recommendation(s):   Calcification of native coronary artery Precordial pain Shortness of breath Moderate coronary calcification, total CAC 174, 97th percentile. Since last office visit denies any reoccurrence of chest pain or shortness of breath. Echocardiogram notes preserved LVEF without any significant valvular heart disease. Stress echocardiogram low risk study. His tenure risk of ASCVD is less than 5% based on current parameters. However, given his obesity with a BMI of 46, history of gastric bypass, moderate CAC, we discussed ways of lowering lipid levels.  Patient is willing to be on pharmacological therapy.  Will start Zetia 10 mg p.o. daily with follow-up labs in 6 weeks to reevaluate LFTs as well as lipids.  Sleep apnea in adult Compliant with his CPAP on regular basis.  Palpitations Symptoms have improved significantly after initiation of BuSpar. Zio patch results reviewed  Class 3 severe obesity due to excess calories without serious comorbidity with body mass index (BMI) of 45.0 to 49.9 in adult [facility]) Body mass index is 46.86 kg/m. Currently enrolled into weight loss management clinic I reviewed with him importance of diet, regular physical activity/exercise, weight loss.   Patient is educated on the importance of increasing physical activity gradually as tolerated with a goal of moderate intensity exercise for 30 minutes a day 5 days a week.  Orders Placed:  Orders Placed This Encounter  Procedures   Comprehensive metabolic panel    Standing Status:   Future    Number of Occurrences:   1    Expected Date:   02/17/2023    Expiration Date:   01/06/2024   LDL cholesterol, direct    Standing Status:   Future    Number of Occurrences:   1  Expected Date:   02/17/2023    Expiration Date:   01/06/2024   Lipid panel    Standing Status:   Future    Number of Occurrences:   1    Expected Date:   02/17/2023    Expiration Date:   01/06/2024   Final Medication List:    Meds ordered this  encounter  Medications   ezetimibe (ZETIA) 10 MG tablet    Sig: Take 1 tablet (10 mg total) by mouth daily.    Dispense:  90 tablet    Refill:  3    There are no discontinued medications.   Current Outpatient Medications:    busPIRone (BUSPAR) 5 MG tablet, Take 5-10 mg by mouth 2 (two) times daily as needed., Disp: , Rfl:    diphenhydramine-acetaminophen (TYLENOL PM) 25-500 MG TABS tablet, Take 1 tablet by mouth at bedtime., Disp: , Rfl:    ezetimibe (ZETIA) 10 MG tablet, Take 1 tablet (10 mg total) by mouth daily., Disp: 90 tablet, Rfl: 3   Multiple Vitamins-Minerals (ZINC PO), Take by mouth., Disp: , Rfl:    omeprazole (PRILOSEC) 20 MG capsule, Take 20 mg by mouth daily., Disp: , Rfl:    valACYclovir (VALTREX) 1000 MG tablet, Take 1,000 mg by mouth daily., Disp: , Rfl: 11  Consent:   NA  Disposition:   1 year follow up .   His questions and concerns were addressed to his satisfaction. He voices understanding of the recommendations provided during this encounter.    Signed, Tessa Lerner, DO, [facility]  [facility]  [address] [place], [place] [address] 01/06/2023 2:41 PM

## 2023-04-21 LAB — LAB REPORT - SCANNED: EGFR: 92
